# Patient Record
Sex: Female | Born: 1950
Health system: Southern US, Community
[De-identification: ages and names within clinical notes are randomized; demographics above are authoritative.]

## PROBLEM LIST (undated history)

## (undated) DIAGNOSIS — M1990 Unspecified osteoarthritis, unspecified site: Secondary | ICD-10-CM

## (undated) DIAGNOSIS — E7800 Pure hypercholesterolemia, unspecified: Secondary | ICD-10-CM

## (undated) DIAGNOSIS — E079 Disorder of thyroid, unspecified: Secondary | ICD-10-CM

## (undated) DIAGNOSIS — I10 Essential (primary) hypertension: Secondary | ICD-10-CM

## (undated) HISTORY — DX: Essential (primary) hypertension: I10

## (undated) HISTORY — DX: Pure hypercholesterolemia, unspecified: E78.00

## (undated) HISTORY — DX: Unspecified osteoarthritis, unspecified site: M19.90

---

## 2015-03-01 DIAGNOSIS — M069 Rheumatoid arthritis, unspecified: Secondary | ICD-10-CM | POA: Insufficient documentation

## 2015-03-01 DIAGNOSIS — M2550 Pain in unspecified joint: Secondary | ICD-10-CM | POA: Insufficient documentation

## 2015-03-01 DIAGNOSIS — R609 Edema, unspecified: Secondary | ICD-10-CM | POA: Insufficient documentation

## 2015-03-01 DIAGNOSIS — M0579 Rheumatoid arthritis with rheumatoid factor of multiple sites without organ or systems involvement: Secondary | ICD-10-CM | POA: Insufficient documentation

## 2016-03-30 DIAGNOSIS — M85852 Other specified disorders of bone density and structure, left thigh: Secondary | ICD-10-CM | POA: Insufficient documentation

## 2016-06-29 DIAGNOSIS — G453 Amaurosis fugax: Secondary | ICD-10-CM | POA: Insufficient documentation

## 2016-10-03 DIAGNOSIS — M8949 Other hypertrophic osteoarthropathy, multiple sites: Secondary | ICD-10-CM | POA: Insufficient documentation

## 2016-10-03 DIAGNOSIS — M159 Polyosteoarthritis, unspecified: Secondary | ICD-10-CM | POA: Insufficient documentation

## 2016-10-03 DIAGNOSIS — Z79899 Other long term (current) drug therapy: Secondary | ICD-10-CM | POA: Insufficient documentation

## 2019-03-07 DIAGNOSIS — I83893 Varicose veins of bilateral lower extremities with other complications: Secondary | ICD-10-CM | POA: Insufficient documentation

## 2019-04-16 DIAGNOSIS — M792 Neuralgia and neuritis, unspecified: Secondary | ICD-10-CM | POA: Insufficient documentation

## 2019-12-31 DIAGNOSIS — N318 Other neuromuscular dysfunction of bladder: Secondary | ICD-10-CM | POA: Insufficient documentation

## 2019-12-31 DIAGNOSIS — I890 Lymphedema, not elsewhere classified: Secondary | ICD-10-CM | POA: Insufficient documentation

## 2019-12-31 DIAGNOSIS — M810 Age-related osteoporosis without current pathological fracture: Secondary | ICD-10-CM | POA: Insufficient documentation

## 2019-12-31 DIAGNOSIS — E785 Hyperlipidemia, unspecified: Secondary | ICD-10-CM | POA: Insufficient documentation

## 2019-12-31 DIAGNOSIS — M5137 Other intervertebral disc degeneration, lumbosacral region: Secondary | ICD-10-CM | POA: Insufficient documentation

## 2019-12-31 DIAGNOSIS — I10 Essential (primary) hypertension: Secondary | ICD-10-CM | POA: Insufficient documentation

## 2019-12-31 DIAGNOSIS — E039 Hypothyroidism, unspecified: Secondary | ICD-10-CM | POA: Insufficient documentation

## 2019-12-31 DIAGNOSIS — D6859 Other primary thrombophilia: Secondary | ICD-10-CM | POA: Insufficient documentation

## 2019-12-31 DIAGNOSIS — Z79899 Other long term (current) drug therapy: Secondary | ICD-10-CM | POA: Insufficient documentation

## 2019-12-31 DIAGNOSIS — R609 Edema, unspecified: Secondary | ICD-10-CM | POA: Insufficient documentation

## 2019-12-31 DIAGNOSIS — Z6841 Body Mass Index (BMI) 40.0 and over, adult: Secondary | ICD-10-CM | POA: Insufficient documentation

## 2019-12-31 DIAGNOSIS — E559 Vitamin D deficiency, unspecified: Secondary | ICD-10-CM | POA: Insufficient documentation

## 2019-12-31 DIAGNOSIS — R635 Abnormal weight gain: Secondary | ICD-10-CM | POA: Insufficient documentation

## 2020-01-07 DIAGNOSIS — M545 Low back pain, unspecified: Secondary | ICD-10-CM | POA: Insufficient documentation

## 2020-01-07 DIAGNOSIS — G8929 Other chronic pain: Secondary | ICD-10-CM | POA: Insufficient documentation

## 2020-03-17 DIAGNOSIS — K219 Gastro-esophageal reflux disease without esophagitis: Secondary | ICD-10-CM | POA: Insufficient documentation

## 2020-03-17 DIAGNOSIS — I872 Venous insufficiency (chronic) (peripheral): Secondary | ICD-10-CM | POA: Insufficient documentation

## 2020-07-05 DIAGNOSIS — M9901 Segmental and somatic dysfunction of cervical region: Secondary | ICD-10-CM | POA: Diagnosis not present

## 2020-07-05 DIAGNOSIS — M5459 Other low back pain: Secondary | ICD-10-CM | POA: Diagnosis not present

## 2020-07-05 DIAGNOSIS — M9902 Segmental and somatic dysfunction of thoracic region: Secondary | ICD-10-CM | POA: Diagnosis not present

## 2020-07-05 DIAGNOSIS — M9903 Segmental and somatic dysfunction of lumbar region: Secondary | ICD-10-CM | POA: Diagnosis not present

## 2020-07-07 DIAGNOSIS — M9901 Segmental and somatic dysfunction of cervical region: Secondary | ICD-10-CM | POA: Diagnosis not present

## 2020-07-07 DIAGNOSIS — M9903 Segmental and somatic dysfunction of lumbar region: Secondary | ICD-10-CM | POA: Diagnosis not present

## 2020-07-07 DIAGNOSIS — M9902 Segmental and somatic dysfunction of thoracic region: Secondary | ICD-10-CM | POA: Diagnosis not present

## 2020-07-07 DIAGNOSIS — M5459 Other low back pain: Secondary | ICD-10-CM | POA: Diagnosis not present

## 2020-07-08 DIAGNOSIS — M5459 Other low back pain: Secondary | ICD-10-CM | POA: Diagnosis not present

## 2020-07-08 DIAGNOSIS — M9902 Segmental and somatic dysfunction of thoracic region: Secondary | ICD-10-CM | POA: Diagnosis not present

## 2020-07-08 DIAGNOSIS — M9903 Segmental and somatic dysfunction of lumbar region: Secondary | ICD-10-CM | POA: Diagnosis not present

## 2020-07-08 DIAGNOSIS — M9901 Segmental and somatic dysfunction of cervical region: Secondary | ICD-10-CM | POA: Diagnosis not present

## 2020-07-12 ENCOUNTER — Emergency Department
Admission: EM | Admit: 2020-07-12 | Discharge: 2020-07-12 | Disposition: A | Discharge status: Home / Self Care | Payer: Medicare HMO | Attending: Emergency Medicine | Admitting: Emergency Medicine

## 2020-07-12 ENCOUNTER — Other Ambulatory Visit: Payer: Self-pay

## 2020-07-12 ENCOUNTER — Emergency Department: Payer: Medicare HMO

## 2020-07-12 DIAGNOSIS — G8929 Other chronic pain: Secondary | ICD-10-CM | POA: Insufficient documentation

## 2020-07-12 DIAGNOSIS — M5431 Sciatica, right side: Secondary | ICD-10-CM

## 2020-07-12 DIAGNOSIS — E039 Hypothyroidism, unspecified: Secondary | ICD-10-CM | POA: Insufficient documentation

## 2020-07-12 DIAGNOSIS — M5441 Lumbago with sciatica, right side: Secondary | ICD-10-CM | POA: Insufficient documentation

## 2020-07-12 DIAGNOSIS — R52 Pain, unspecified: Secondary | ICD-10-CM | POA: Diagnosis not present

## 2020-07-12 DIAGNOSIS — M25551 Pain in right hip: Secondary | ICD-10-CM | POA: Diagnosis not present

## 2020-07-12 DIAGNOSIS — I10 Essential (primary) hypertension: Secondary | ICD-10-CM | POA: Diagnosis not present

## 2020-07-12 DIAGNOSIS — M25572 Pain in left ankle and joints of left foot: Secondary | ICD-10-CM | POA: Diagnosis not present

## 2020-07-12 MED ORDER — METHOCARBAMOL 500 MG PO TABS
500.0000 mg | ORAL_TABLET | Freq: Once | ORAL | Status: AC
  Administered 2020-07-12: 500 mg via ORAL
  Filled 2020-07-12 (×2): qty 1

## 2020-07-12 MED ORDER — ACETAMINOPHEN 500 MG PO TABS
1000.0000 mg | ORAL_TABLET | Freq: Once | ORAL | Status: AC
  Administered 2020-07-12: 1000 mg via ORAL
  Filled 2020-07-12: qty 2

## 2020-07-12 MED ORDER — KETOROLAC TROMETHAMINE 30 MG/ML IJ SOLN
30.0000 mg | Freq: Once | INTRAMUSCULAR | Status: AC
  Administered 2020-07-12: 30 mg via INTRAMUSCULAR
  Filled 2020-07-12: qty 1

## 2020-07-12 NOTE — ED Provider Notes (Signed)
[facility] Emergency Department Provider Note ____________________________________________   First MD Initiated Contact with Patient 07/12/20 [phone]     (approximate)  I have reviewed the triage vital signs and the nursing notes.  HISTORY  Chief Complaint Hip Pain   HPI Kristen Marquez is a 69 y.o. femalewho presents to the ED for evaluation of hip pain.   Chart review indicates history of HTN, HLD, hypothyroidism, rheumatoid arthritis, morbid obesity and chronic low back pain.  Patient reports she recently moved to the state from [place] and is awaiting establishment with a new PCP, appointment upcoming.  She indicates dealing with chronic lower back pain, as well as chronic shooting pains down her right buttocks associated with this.  She indicates that she saw a new chiropractor 4 days ago, and for the past 3 days has had acute on chronic worsening pain of her right lumbar back rating down her right hip and buttocks.  She denies fevers, falls, injury, difficulty toileting, numbness or tingling to her groin.  She indicates improved symptoms with her meloxicam, prescribed for her RA.  She indicates associated increased lower extremity swelling symmetrically bilaterally.  She reports the pain is currently 7/10 intensity, worsened with movement, and she has not taken any medications in about 24 hours.    No past medical history on file.  There are no problems to display for this patient.   Prior to Admission medications   Not on File    Allergies Patient has no known allergies.  No family history on file.  Social History Social History   Tobacco Use  . Smoking status: Not on file  Substance Use Topics  . Alcohol use: Not on file  . Drug use: Not on file    Review of Systems  Constitutional: No fever/chills Eyes: No visual changes. ENT: No sore throat. Cardiovascular: Denies chest pain. Respiratory: Denies shortness of  breath. Gastrointestinal: No abdominal pain.  No nausea, no vomiting.  No diarrhea.  No constipation. Genitourinary: Negative for dysuria. Musculoskeletal: Positive for acute on chronic low back pain and atraumatic right-sided hip pain Skin: Negative for rash. Neurological: Negative for headaches, focal weakness or numbness.  ____________________________________________   PHYSICAL EXAM:  VITAL SIGNS: Vitals:   07/12/20 0239  BP: (!) 181/74  Pulse: 86  Resp: 16  Temp: 97.9 F (36.6 C)  SpO2: 98%     Constitutional: Alert and oriented. Well appearing and in no acute distress.  Obese.  Supine in bed and conversational in full sentences. Eyes: Conjunctivae are normal. PERRL. EOMI. Head: Atraumatic. Nose: No congestion/rhinnorhea. Mouth/Throat: Mucous membranes are moist.  Oropharynx non-erythematous. Neck: No stridor. No cervical spine tenderness to palpation. Cardiovascular: Normal rate, regular rhythm. Grossly normal heart sounds.  Good peripheral circulation. Respiratory: Normal respiratory effort.  No retractions. Lungs CTAB. Gastrointestinal: Soft , nondistended, nontender to palpation. No abdominal bruits. No CVA tenderness. Musculoskeletal:   No joint effusions. No signs of acute trauma. Vague and poorly localizing mild tenderness to palpation to her right lumbar back and right hip that reproduces her symptoms of shooting pains down her right buttocks and hip. Neurologic:  Normal speech and language. No gross focal neurologic deficits are appreciated. No gait instability noted. Skin:  Skin is warm, dry and intact. No rash noted. Psychiatric: Mood and affect are normal. Speech and behavior are normal.  ____________________________________________   LABS (all labs ordered are listed, but only abnormal results are displayed)  Labs Reviewed - No data to display  ____________________________________________  RADIOLOGY  ED MD interpretation: Plain film of pelvis and  right hip reviewed by me without evidence of acute bony injury.  Official radiology report(s): DG Hip Unilat W or Wo Pelvis 2-3 Views Right  Result Date: 07/12/2020 CLINICAL DATA:  Right hip pain EXAM: DG HIP (WITH OR WITHOUT PELVIS) 2-3V RIGHT COMPARISON:  None. FINDINGS: Single view radiograph of the pelvis and two view radiograph of the right hip demonstrates normal alignment. No fracture or dislocation. No destructive osseous lesion. Hip joint spaces are preserved. Heterotopic ossification is seen adjacent to the greater trochanters bilaterally left greater than right. Soft tissues are unremarkable. IMPRESSION: No acute fracture or dislocation. Electronically Signed   By: Helyn Numbers MD   On: 07/12/2020 03:38    ____________________________________________   PROCEDURES and INTERVENTIONS  Procedure(s) performed (including Critical Care):  Procedures  Medications  acetaminophen (TYLENOL) tablet 1,000 mg (has no administration in time range)  ketorolac (TORADOL) 30 MG/ML injection 30 mg (has no administration in time range)  methocarbamol (ROBAXIN) tablet 500 mg (has no administration in time range)    ____________________________________________   MDM / ED COURSE  Obese 69 year old female with a history of chronic low back pain presents to the ED with acute on chronic atraumatic pain, consistent with sciatica without red flag features, and amenable to outpatient management.  Exam demonstrates classic symptoms and signs of sciatica without red flag symptoms to suggest cord compression.  No evidence of trauma.  She is neurovascularly intact without distress.  Improving pain with Tylenol, NSAIDs and Robaxin.  Advised patient to sparingly use her NSAIDs at home in combination with printed exercises for sciatica, we discussed return precautions for the ED.  Patient medically stable for discharge home.   ____________________________________________   FINAL CLINICAL IMPRESSION(S) / ED  DIAGNOSES  Final diagnoses:  Sciatica of right side  Right hip pain  Primary hypertension     ED Discharge Orders    None       Kristen Marquez   Note:  This document was prepared using Dragon voice recognition software and may include unintentional dictation errors.   Delton Prairie, MD 07/12/20 [phone]

## 2020-07-12 NOTE — Discharge Instructions (Signed)
Use Tylenol for pain and fevers.  Up to 1000 mg per dose, up to 4 times per day.  Do not take more than 4000 mg of Tylenol/acetaminophen within 24 hours..  Continue to take your meloxicam/Mobic as well, as this will be the most helpful medication for your symptoms.  More importantly than either of the above medications is the attached exercises for sciatica to improve your range of motion and strength of the muscles in your back.  If you develop any fevers, inability to walk or inability to use the bathroom, please return to the ED immediately. Otherwise, I have attached the phone number for [facility] clinic, this is a local group of physicians who may be able to see you sooner than March to establish with a new PCP.

## 2020-07-12 NOTE — ED Notes (Signed)
Patient given update regarding wait time, verbalized understanding. 

## 2020-07-12 NOTE — ED Triage Notes (Signed)
Pt with right posterior hip pain that radiates down leg intermittently and into back. Pt denies known injury. Pt with history of arthritis, denies fever.

## 2020-07-12 NOTE — ED Notes (Signed)
E-signature not working at this time. Pt verbalized understanding of D/C instructions, prescriptions and follow up care with no further questions at this time. Pt in NAD and wheeled to lobby at time of D/C.  

## 2020-07-14 ENCOUNTER — Ambulatory Visit: Payer: Self-pay

## 2020-07-14 DIAGNOSIS — M9906 Segmental and somatic dysfunction of lower extremity: Secondary | ICD-10-CM | POA: Diagnosis not present

## 2020-07-14 DIAGNOSIS — M9901 Segmental and somatic dysfunction of cervical region: Secondary | ICD-10-CM | POA: Diagnosis not present

## 2020-07-14 DIAGNOSIS — L98491 Non-pressure chronic ulcer of skin of other sites limited to breakdown of skin: Secondary | ICD-10-CM | POA: Diagnosis not present

## 2020-07-14 DIAGNOSIS — M9903 Segmental and somatic dysfunction of lumbar region: Secondary | ICD-10-CM | POA: Diagnosis not present

## 2020-07-14 DIAGNOSIS — I739 Peripheral vascular disease, unspecified: Secondary | ICD-10-CM | POA: Diagnosis not present

## 2020-07-14 DIAGNOSIS — L03116 Cellulitis of left lower limb: Secondary | ICD-10-CM | POA: Diagnosis not present

## 2020-07-14 DIAGNOSIS — R609 Edema, unspecified: Secondary | ICD-10-CM | POA: Diagnosis not present

## 2020-07-14 DIAGNOSIS — M9902 Segmental and somatic dysfunction of thoracic region: Secondary | ICD-10-CM | POA: Diagnosis not present

## 2020-07-14 NOTE — Telephone Encounter (Signed)
Patient call stating that her legs ae Swollen and she tried to get established with [facility].  She states that the swelling in her legs is a chronic condition that she has been treated for for many years.  She state that the left leg is red and when at the chiropractor today they suggested that she see a physician.  A note was sent to [facility] by agent to try to get patient earlier appointment  She refused first available because see needed care now.  She states that her left leg has some sore  developing. Swelling includes above knee and feet.  Feet feel numb.  Patient will [facility] to [facility] today for care.  She was encouraged to please call back in morning and take whatever visit we can give for establishing care to make sure she gets establish with a PCP. She verbalized understanding of all information and agrees with plan of care.  Reason for Disposition . SEVERE leg swelling (e.g., swelling extends above knee, entire leg is swollen, weeping fluid)  Answer Assessment - Initial Assessment Questions 1. ONSET: "When did the swelling start?" (e.g., minutes, hours, days)     A lone time 2. LOCATION: "What part of the leg is swollen?"  "Are both legs swollen or just one leg?"     Both legs left is red 3. SEVERITY: "How bad is the swelling?" (e.g., localized; mild, moderate, severe)  - Localized - small area of swelling localized to one leg  - MILD pedal edema - swelling limited to foot and ankle, pitting edema < 1/4 inch (6 mm) deep, rest and elevation eliminate most or all swelling  - MODERATE edema - swelling of lower leg to knee, pitting edema > 1/4 inch (6 mm) deep, rest and elevation only partially reduce swelling  - SEVERE edema - swelling extends above knee, facial or hand swelling present    Above the knee 4. REDNESS: "Does the swelling look red or infected?"    In left leg 5. PAIN: "Is the swelling painful to touch?" If Yes, ask: "How painful is it?"   (Scale 1-10; mild, moderate or  severe)     Rt foot feels banded around it below ankle 6. FEVER: "Do you have a fever?" If Yes, ask: "What is it, how was it measured, and when did it start?"      No feet are numb 7. CAUSE: "What do you think is causing the leg swelling?"     unsure 8. MEDICAL HISTORY: "Do you have a history of heart failure, kidney disease, liver failure, or cancer?"    Ra nothing else 9. RECURRENT SYMPTOM: "Have you had leg swelling before?" If Yes, ask: "When was the last time?" "What happened that time?"    Yes is chronic 10. OTHER SYMPTOMS: "Do you have any other symptoms?" (e.g., chest pain, difficulty breathing)      No 11. PREGNANCY: "Is there any chance you are pregnant?" "When was your last menstrual period?"       N/A  Protocols used: LEG SWELLING AND EDEMA-A-AH

## 2020-07-15 DIAGNOSIS — M5136 Other intervertebral disc degeneration, lumbar region: Secondary | ICD-10-CM | POA: Diagnosis not present

## 2020-07-17 DIAGNOSIS — M5136 Other intervertebral disc degeneration, lumbar region: Secondary | ICD-10-CM | POA: Diagnosis not present

## 2020-07-17 DIAGNOSIS — M545 Low back pain, unspecified: Secondary | ICD-10-CM | POA: Diagnosis not present

## 2020-07-22 ENCOUNTER — Encounter (INDEPENDENT_AMBULATORY_CARE_PROVIDER_SITE_OTHER): Payer: Self-pay | Admitting: Nurse Practitioner

## 2020-07-22 ENCOUNTER — Ambulatory Visit (INDEPENDENT_AMBULATORY_CARE_PROVIDER_SITE_OTHER): Payer: Medicare HMO | Admitting: Nurse Practitioner

## 2020-07-22 ENCOUNTER — Other Ambulatory Visit: Payer: Self-pay

## 2020-07-22 VITALS — BP 135/79 | HR 75 | Ht 61.0 in | Wt 262.0 lb

## 2020-07-22 DIAGNOSIS — I890 Lymphedema, not elsewhere classified: Secondary | ICD-10-CM

## 2020-07-22 DIAGNOSIS — E785 Hyperlipidemia, unspecified: Secondary | ICD-10-CM | POA: Diagnosis not present

## 2020-07-22 DIAGNOSIS — K219 Gastro-esophageal reflux disease without esophagitis: Secondary | ICD-10-CM | POA: Diagnosis not present

## 2020-07-29 ENCOUNTER — Encounter (INDEPENDENT_AMBULATORY_CARE_PROVIDER_SITE_OTHER): Payer: Self-pay | Admitting: Nurse Practitioner

## 2020-07-29 DIAGNOSIS — M5441 Lumbago with sciatica, right side: Secondary | ICD-10-CM | POA: Diagnosis not present

## 2020-07-29 DIAGNOSIS — Z79899 Other long term (current) drug therapy: Secondary | ICD-10-CM | POA: Diagnosis not present

## 2020-07-29 DIAGNOSIS — M7989 Other specified soft tissue disorders: Secondary | ICD-10-CM | POA: Diagnosis not present

## 2020-07-29 DIAGNOSIS — G8929 Other chronic pain: Secondary | ICD-10-CM | POA: Diagnosis not present

## 2020-07-29 DIAGNOSIS — M19042 Primary osteoarthritis, left hand: Secondary | ICD-10-CM | POA: Diagnosis not present

## 2020-07-29 DIAGNOSIS — M8949 Other hypertrophic osteoarthropathy, multiple sites: Secondary | ICD-10-CM | POA: Diagnosis not present

## 2020-07-29 DIAGNOSIS — M0579 Rheumatoid arthritis with rheumatoid factor of multiple sites without organ or systems involvement: Secondary | ICD-10-CM | POA: Diagnosis not present

## 2020-07-29 NOTE — Progress Notes (Signed)
Subjective:    Patient ID: Kristen Marquez, female    DOB: 16-May-1951, 69 y.o.   MRN: [id number] Chief Complaint  Patient presents with  . New Patient (Initial Visit)    Stat. Wynona Canes.Pitting.edema . Ulcer    Patient is seen for evaluation of leg pain and leg swelling. The patient first noticed the swelling remotely. The swelling is associated with pain and discoloration. The pain and swelling worsens with prolonged dependency and improves with elevation. The pain is unrelated to activity.  The patient notes that in the morning the legs are significantly improved but they steadily worsened throughout the course of the day. The patient also notes a steady worsening of the discoloration in the ankle and shin area.   The patient denies claudication symptoms.  The patient denies symptoms consistent with rest pain.  The patient denies and extensive history of DJD and LS spine disease.  The patient has no had any past angiography, interventions or vascular surgery.  Elevation makes the leg symptoms better, dependency makes them much worse. There is no history of ulcerations. The patient denies any recent changes in medications.  The patient has not been wearing graduated compression.  The patient denies a history of DVT or PE. There is no prior history of phlebitis. There is no history of primary lymphedema.  No history of malignancies. No history of trauma or groin or pelvic surgery. There is no history of radiation treatment to the groin or pelvis  The patient denies amaurosis fugax or recent TIA symptoms. There are no recent neurological changes noted. The patient denies recent episodes of angina or shortness of breath   Review of Systems  Cardiovascular: Positive for leg swelling.  Skin: Negative for wound.  All other systems reviewed and are negative.      Objective:   Physical Exam Vitals reviewed.  Constitutional:      Appearance: She is obese.  HENT:     Head: Normocephalic.   Cardiovascular:     Rate and Rhythm: Normal rate and regular rhythm.     Pulses: Normal pulses.  Pulmonary:     Effort: Pulmonary effort is normal.  Musculoskeletal:     Right lower leg: Edema present.     Left lower leg: Edema present.  Skin:    General: Skin is warm and dry.  Neurological:     Mental Status: She is alert and oriented to person, place, and time. Mental status is at baseline.  Psychiatric:        Mood and Affect: Mood normal.        Behavior: Behavior normal.        Thought Content: Thought content normal.        Judgment: Judgment normal.     BP 135/79   Pulse 75   Ht 5\' 1"  (1.549 m)   Wt 262 lb (118.8 kg)   BMI 49.50 kg/m   Past Medical History:  Diagnosis Date  . Arthritis   . High cholesterol   . Hypertension     Social History   Socioeconomic History  . Marital status: Married    Spouse name: Not on file  . Number of children: Not on file  . Years of education: Not on file  . Highest education level: Not on file  Occupational History  . Not on file  Tobacco Use  . Smoking status: Current Every Day Smoker    Types: Cigarettes    Start date: 07/23/1995  . Smokeless tobacco: Never  Used  Substance and Sexual Activity  . Alcohol use: Not on file  . Drug use: Not on file  . Sexual activity: Not on file  Other Topics Concern  . Not on file  Social History Narrative  . Not on file   Social Determinants of Health   Financial Resource Strain:   . Difficulty of Paying Living Expenses: Not on file  Food Insecurity:   . Worried About Programme researcher, broadcasting/film/video in the Last Year: Not on file  . Ran Out of Food in the Last Year: Not on file  Transportation Needs:   . Lack of Transportation (Medical): Not on file  . Lack of Transportation (Non-Medical): Not on file  Physical Activity:   . Days of Exercise per Week: Not on file  . Minutes of Exercise per Session: Not on file  Stress:   . Feeling of Stress : Not on file  Social Connections:   .  Frequency of Communication with Friends and Family: Not on file  . Frequency of Social Gatherings with Friends and Family: Not on file  . Attends Religious Services: Not on file  . Active Member of Clubs or Organizations: Not on file  . Attends Banker Meetings: Not on file  . Marital Status: Not on file  Intimate Partner Violence:   . Fear of Current or Ex-Partner: Not on file  . Emotionally Abused: Not on file  . Physically Abused: Not on file  . Sexually Abused: Not on file    History reviewed. No pertinent surgical history.  History reviewed. No pertinent family history.  Allergies  Allergen Reactions  . Diltiazem Hcl Other (See Comments)    Headache Headache     No flowsheet data found.    CMP  No results found for: NA, K, CL, CO2, GLUCOSE, BUN, CREATININE, CALCIUM, PROT, ALBUMIN, AST, ALT, ALKPHOS, BILITOT, GFRNONAA, GFRAA   No results found.     Assessment & Plan:   1. Lymphedema, not elsewhere classified I have had a long discussion with the patient regarding swelling and why it  causes symptoms.  Patient will begin wearing graduated compression stockings class 1 (20-30 mmHg) on a daily basis a prescription was given. The patient will  beginning wearing the stockings first thing in the morning and removing them in the evening. The patient is instructed specifically not to sleep in the stockings.   In addition, behavioral modification will be initiated.  This will include frequent elevation, use of over the counter pain medications and exercise such as walking.  I have reviewed systemic causes for chronic edema such as liver, kidney and cardiac etiologies.  The patient denies problems with these organ systems.    Consideration for a lymph pump will also be made based upon the effectiveness of conservative therapy.  This would help to improve the edema control and prevent sequela such as ulcers and infections   Patient should undergo duplex ultrasound  of the venous system to ensure that DVT or reflux is not present.  The patient will follow-up with me after the ultrasound.    2. Gastroesophageal reflux disease, unspecified whether esophagitis present Continue PPI as already ordered, this medication has been reviewed and there are no changes at this time.  Avoidence of caffeine and alcohol  Moderate elevation of the head of the bed   3. Hyperlipidemia, unspecified hyperlipidemia type Continue statin as ordered and reviewed, no changes at this time    Current Outpatient Medications on File  Prior to Visit  Medication Sig Dispense Refill  . amLODipine (NORVASC) 5 MG tablet Take by mouth.    Marland Kitchen atorvastatin (LIPITOR) 40 MG tablet Take by mouth.    . folic acid (FOLVITE) 1 MG tablet Take by mouth.    . furosemide (LASIX) 20 MG tablet Take by mouth.    . levothyroxine (SYNTHROID) 100 MCG tablet Take by mouth.    . meclizine (ANTIVERT) 25 MG tablet Take by mouth.    . meloxicam (MOBIC) 7.5 MG tablet Take by mouth.    . methotrexate (RHEUMATREX) 2.5 MG tablet Take by mouth.    . metoprolol tartrate (LOPRESSOR) 25 MG tablet Take by mouth.    . oxybutynin (DITROPAN) 5 MG tablet Take by mouth.    . pantoprazole (PROTONIX) 40 MG tablet Take by mouth.    . simvastatin (ZOCOR) 20 MG tablet     . aspirin 81 MG chewable tablet Chew by mouth.    . Cholecalciferol 50 MCG (2000 UT) CAPS Take by mouth.     No current facility-administered medications on file prior to visit.    There are no Patient Instructions on file for this visit. No follow-ups on file.   Georgiana Spinner, NP

## 2020-07-30 DIAGNOSIS — M545 Low back pain, unspecified: Secondary | ICD-10-CM | POA: Diagnosis not present

## 2020-07-30 DIAGNOSIS — M5136 Other intervertebral disc degeneration, lumbar region: Secondary | ICD-10-CM | POA: Diagnosis not present

## 2020-08-03 DIAGNOSIS — M5136 Other intervertebral disc degeneration, lumbar region: Secondary | ICD-10-CM | POA: Diagnosis not present

## 2020-08-03 DIAGNOSIS — M545 Low back pain, unspecified: Secondary | ICD-10-CM | POA: Diagnosis not present

## 2020-08-10 ENCOUNTER — Other Ambulatory Visit (INDEPENDENT_AMBULATORY_CARE_PROVIDER_SITE_OTHER): Payer: Self-pay | Admitting: Nurse Practitioner

## 2020-08-10 DIAGNOSIS — I890 Lymphedema, not elsewhere classified: Secondary | ICD-10-CM

## 2020-08-11 ENCOUNTER — Other Ambulatory Visit: Payer: Self-pay

## 2020-08-11 ENCOUNTER — Ambulatory Visit (INDEPENDENT_AMBULATORY_CARE_PROVIDER_SITE_OTHER): Payer: Medicare HMO | Admitting: Nurse Practitioner

## 2020-08-11 ENCOUNTER — Ambulatory Visit (INDEPENDENT_AMBULATORY_CARE_PROVIDER_SITE_OTHER): Payer: Medicare HMO

## 2020-08-11 VITALS — BP 168/77 | HR 76 | Ht 61.0 in | Wt 272.0 lb

## 2020-08-11 DIAGNOSIS — I890 Lymphedema, not elsewhere classified: Secondary | ICD-10-CM

## 2020-08-11 DIAGNOSIS — E785 Hyperlipidemia, unspecified: Secondary | ICD-10-CM | POA: Diagnosis not present

## 2020-08-11 DIAGNOSIS — I10 Essential (primary) hypertension: Secondary | ICD-10-CM

## 2020-08-13 DIAGNOSIS — M5136 Other intervertebral disc degeneration, lumbar region: Secondary | ICD-10-CM | POA: Diagnosis not present

## 2020-08-13 DIAGNOSIS — M545 Low back pain, unspecified: Secondary | ICD-10-CM | POA: Diagnosis not present

## 2020-08-16 ENCOUNTER — Encounter (INDEPENDENT_AMBULATORY_CARE_PROVIDER_SITE_OTHER): Payer: Self-pay | Admitting: Nurse Practitioner

## 2020-08-16 NOTE — Progress Notes (Signed)
Subjective:    Patient ID: Kristen Marquez, female    DOB: [birth year], 69 y.o.   MRN: [id number] Chief Complaint  Patient presents with  . Follow-up    U/S follow up    The patient returns to the office for followup evaluation regarding leg swelling.  The swelling has persisted and the pain associated with swelling continues. There have not been any interval development of a ulcerations or wounds.  Since the previous visit the patient has been wearing graduated compression stockings and has noted little if any improvement in the lymphedema. The patient has been trying to use compression but has been unable to find a good pair of compression.   The patient also states elevation during the day and exercise is being done too.   Today noninvasive studies show no evidence of DVT or superficial venous thrombosis bilaterally.  There is evidence of reflux in the right great saphenous vein at the saphenofemoral femoral junction.  No evidence of deep venous insufficiency seen bilaterally.  There is free fluid noted in the bilateral knees.  The patient also has evidence of previous ablation and stripping of the great saphenous vein bilaterally.   Review of Systems  Cardiovascular: Positive for leg swelling.  Musculoskeletal: Positive for arthralgias and gait problem.  All other systems reviewed and are negative.      Objective:   Physical Exam Vitals reviewed.  Constitutional:      Appearance: She is obese.  HENT:     Head: Normocephalic.  Cardiovascular:     Rate and Rhythm: Normal rate.     Pulses: Decreased pulses.  Pulmonary:     Effort: Pulmonary effort is normal.  Musculoskeletal:     Right lower leg: 3+ Edema present.     Left lower leg: 3+ Edema present.  Neurological:     Mental Status: She is alert and oriented to person, place, and time.  Psychiatric:        Mood and Affect: Mood normal.        Behavior: Behavior normal.        Thought Content: Thought content normal.         Judgment: Judgment normal.     BP (!) 168/77   Pulse 76   Ht 5\' 1"  (1.549 m)   Wt 272 lb (123.4 kg)   BMI 51.39 kg/m   Past Medical History:  Diagnosis Date  . Arthritis   . High cholesterol   . Hypertension     Social History   Socioeconomic History  . Marital status: Married    Spouse name: Not on file  . Number of children: Not on file  . Years of education: Not on file  . Highest education level: Not on file  Occupational History  . Not on file  Tobacco Use  . Smoking status: Current Every Day Smoker    Types: Cigarettes    Start date: 07/23/1995  . Smokeless tobacco: Never Used  Substance and Sexual Activity  . Alcohol use: Not on file  . Drug use: Not on file  . Sexual activity: Not on file  Other Topics Concern  . Not on file  Social History Narrative  . Not on file   Social Determinants of Health   Financial Resource Strain:   . Difficulty of Paying Living Expenses: Not on file  Food Insecurity:   . Worried About Programme researcher, broadcasting/film/video in the Last Year: Not on file  . Ran Out of Food in  the Last Year: Not on file  Transportation Needs:   . Lack of Transportation (Medical): Not on file  . Lack of Transportation (Non-Medical): Not on file  Physical Activity:   . Days of Exercise per Week: Not on file  . Minutes of Exercise per Session: Not on file  Stress:   . Feeling of Stress : Not on file  Social Connections:   . Frequency of Communication with Friends and Family: Not on file  . Frequency of Social Gatherings with Friends and Family: Not on file  . Attends Religious Services: Not on file  . Active Member of Clubs or Organizations: Not on file  . Attends Banker Meetings: Not on file  . Marital Status: Not on file  Intimate Partner Violence:   . Fear of Current or Ex-Partner: Not on file  . Emotionally Abused: Not on file  . Physically Abused: Not on file  . Sexually Abused: Not on file    History reviewed. No pertinent  surgical history.  History reviewed. No pertinent family history.  Allergies  Allergen Reactions  . Diltiazem Hcl Other (See Comments)    Headache Headache     No flowsheet data found.    CMP  No results found for: NA, K, CL, CO2, GLUCOSE, BUN, CREATININE, CALCIUM, PROT, ALBUMIN, AST, ALT, ALKPHOS, BILITOT, GFRNONAA, GFRAA   No results found.     Assessment & Plan:   1. Lymphedema Recommend:  No surgery or intervention at this point in time.    We will place patient in Unna wraps to regain control of edema, to be changed weekly.  I have reviewed my previous discussion with the patient regarding swelling and why it causes symptoms.  Patient will continue wearing graduated compression stockings class 1 (20-30 mmHg) on a daily basis. The patient will  beginning wearing the stockings first thing in the morning and removing them in the evening. The patient is instructed specifically not to sleep in the stockings.    In addition, behavioral modification including several periods of elevation of the lower extremities during the day will be continued.  This was reviewed with the patient during the initial visit.  The patient will also continue routine exercise, especially walking on a daily basis as was discussed during the initial visit.    Despite conservative treatments of at least 4 weeks including graduated compression therapy class 1 and behavioral modification including exercise and elevation the patient  has not obtained adequate control of the lymphedema.  The patient still has stage 3 lymphedema and therefore, I believe that a lymph pump should be added to improve the control of the patient's lymphedema.  Additionally, a lymph pump is warranted because it will reduce the risk of cellulitis and ulceration in the future.  Patient should follow-up in four weeks   2. Benign essential hypertension Continue antihypertensive medications as already ordered, these medications have  been reviewed and there are no changes at this time.   3. Hyperlipidemia, unspecified hyperlipidemia type Continue statin as ordered and reviewed, no changes at this time    Current Outpatient Medications on File Prior to Visit  Medication Sig Dispense Refill  . amLODipine (NORVASC) 5 MG tablet Take by mouth.    Marland Kitchen aspirin 81 MG chewable tablet Chew by mouth.    Marland Kitchen atorvastatin (LIPITOR) 40 MG tablet Take by mouth.    . Cholecalciferol 50 MCG (2000 UT) CAPS Take by mouth.    . folic acid (FOLVITE) 1 MG  tablet Take by mouth.    . furosemide (LASIX) 20 MG tablet Take by mouth.    . levothyroxine (SYNTHROID) 100 MCG tablet Take by mouth.    . meclizine (ANTIVERT) 25 MG tablet Take by mouth.    . meloxicam (MOBIC) 7.5 MG tablet Take by mouth.    . methotrexate (RHEUMATREX) 2.5 MG tablet Take by mouth.    . metoprolol tartrate (LOPRESSOR) 25 MG tablet Take by mouth.    . oxybutynin (DITROPAN) 5 MG tablet Take by mouth.    . pantoprazole (PROTONIX) 40 MG tablet Take by mouth.    . simvastatin (ZOCOR) 20 MG tablet      No current facility-administered medications on file prior to visit.    There are no Patient Instructions on file for this visit. No follow-ups on file.   Georgiana Spinner, NP

## 2020-08-18 ENCOUNTER — Other Ambulatory Visit: Payer: Self-pay

## 2020-08-18 ENCOUNTER — Ambulatory Visit (INDEPENDENT_AMBULATORY_CARE_PROVIDER_SITE_OTHER): Payer: Medicare HMO | Admitting: Nurse Practitioner

## 2020-08-18 ENCOUNTER — Encounter (INDEPENDENT_AMBULATORY_CARE_PROVIDER_SITE_OTHER): Payer: Self-pay

## 2020-08-18 VITALS — BP 167/83 | HR 72 | Resp 16 | Wt 271.0 lb

## 2020-08-18 DIAGNOSIS — I890 Lymphedema, not elsewhere classified: Secondary | ICD-10-CM | POA: Diagnosis not present

## 2020-08-18 NOTE — Progress Notes (Signed)
History of Present Illness  There is no documented history at this time  Assessments & Plan   There are no diagnoses linked to this encounter.    Additional instructions  Subjective:  Patient presents with venous ulcer of the Bilateral lower extremity.    Procedure:  3 layer unna wrap was placed Bilateral lower extremity.   Plan:   Follow up in one week.  

## 2020-08-19 DIAGNOSIS — M5136 Other intervertebral disc degeneration, lumbar region: Secondary | ICD-10-CM | POA: Diagnosis not present

## 2020-08-19 DIAGNOSIS — M545 Low back pain, unspecified: Secondary | ICD-10-CM | POA: Diagnosis not present

## 2020-08-20 DIAGNOSIS — M545 Low back pain, unspecified: Secondary | ICD-10-CM | POA: Diagnosis not present

## 2020-08-22 ENCOUNTER — Encounter (INDEPENDENT_AMBULATORY_CARE_PROVIDER_SITE_OTHER): Payer: Self-pay | Admitting: Nurse Practitioner

## 2020-08-24 ENCOUNTER — Ambulatory Visit (INDEPENDENT_AMBULATORY_CARE_PROVIDER_SITE_OTHER): Payer: Medicare HMO | Admitting: Nurse Practitioner

## 2020-08-24 ENCOUNTER — Other Ambulatory Visit: Payer: Self-pay

## 2020-08-24 VITALS — BP 163/79 | HR 70 | Ht 61.0 in | Wt 271.0 lb

## 2020-08-24 DIAGNOSIS — I890 Lymphedema, not elsewhere classified: Secondary | ICD-10-CM | POA: Diagnosis not present

## 2020-08-24 NOTE — Progress Notes (Signed)
History of Present Illness  There is no documented history at this time  Assessments & Plan   There are no diagnoses linked to this encounter.    Additional instructions  Subjective:  Patient presents with venous ulcer of the Bilateral lower extremity.    Procedure:  3 layer unna wrap was placed Bilateral lower extremity.   Plan:   Follow up in one week.2 

## 2020-08-25 ENCOUNTER — Encounter (INDEPENDENT_AMBULATORY_CARE_PROVIDER_SITE_OTHER): Payer: Medicare HMO

## 2020-08-30 ENCOUNTER — Encounter (INDEPENDENT_AMBULATORY_CARE_PROVIDER_SITE_OTHER): Payer: Self-pay | Admitting: Nurse Practitioner

## 2020-09-01 ENCOUNTER — Other Ambulatory Visit: Payer: Self-pay

## 2020-09-01 ENCOUNTER — Encounter (INDEPENDENT_AMBULATORY_CARE_PROVIDER_SITE_OTHER): Payer: Self-pay | Admitting: Nurse Practitioner

## 2020-09-01 ENCOUNTER — Ambulatory Visit (INDEPENDENT_AMBULATORY_CARE_PROVIDER_SITE_OTHER): Payer: Medicare HMO | Admitting: Nurse Practitioner

## 2020-09-01 VITALS — BP 165/86 | HR 82 | Resp 16 | Wt 272.0 lb

## 2020-09-01 DIAGNOSIS — I890 Lymphedema, not elsewhere classified: Secondary | ICD-10-CM

## 2020-09-01 DIAGNOSIS — I10 Essential (primary) hypertension: Secondary | ICD-10-CM | POA: Diagnosis not present

## 2020-09-01 DIAGNOSIS — E785 Hyperlipidemia, unspecified: Secondary | ICD-10-CM

## 2020-09-07 ENCOUNTER — Other Ambulatory Visit: Payer: Self-pay

## 2020-09-07 ENCOUNTER — Ambulatory Visit (INDEPENDENT_AMBULATORY_CARE_PROVIDER_SITE_OTHER): Payer: Medicare HMO | Admitting: Nurse Practitioner

## 2020-09-07 ENCOUNTER — Encounter (INDEPENDENT_AMBULATORY_CARE_PROVIDER_SITE_OTHER): Payer: Self-pay

## 2020-09-07 ENCOUNTER — Encounter (INDEPENDENT_AMBULATORY_CARE_PROVIDER_SITE_OTHER): Payer: Self-pay | Admitting: Nurse Practitioner

## 2020-09-07 VITALS — BP 181/80 | HR 77 | Resp 16 | Wt 276.0 lb

## 2020-09-07 DIAGNOSIS — I890 Lymphedema, not elsewhere classified: Secondary | ICD-10-CM | POA: Diagnosis not present

## 2020-09-07 NOTE — Progress Notes (Signed)
History of Present Illness  There is no documented history at this time  Assessments & Plan   There are no diagnoses linked to this encounter.    Additional instructions  Subjective:  Patient presents with venous ulcer of the Bilateral lower extremity.    Procedure:  3 layer unna wrap was placed Bilateral lower extremity.   Plan:   Follow up in one week.  

## 2020-09-11 ENCOUNTER — Encounter: Payer: Self-pay | Admitting: Emergency Medicine

## 2020-09-11 DIAGNOSIS — Z79899 Other long term (current) drug therapy: Secondary | ICD-10-CM | POA: Diagnosis not present

## 2020-09-11 DIAGNOSIS — E039 Hypothyroidism, unspecified: Secondary | ICD-10-CM | POA: Insufficient documentation

## 2020-09-11 DIAGNOSIS — Z7982 Long term (current) use of aspirin: Secondary | ICD-10-CM | POA: Diagnosis not present

## 2020-09-11 DIAGNOSIS — I10 Essential (primary) hypertension: Secondary | ICD-10-CM | POA: Insufficient documentation

## 2020-09-11 DIAGNOSIS — N179 Acute kidney failure, unspecified: Secondary | ICD-10-CM | POA: Insufficient documentation

## 2020-09-11 DIAGNOSIS — G4489 Other headache syndrome: Secondary | ICD-10-CM | POA: Diagnosis not present

## 2020-09-11 DIAGNOSIS — F17210 Nicotine dependence, cigarettes, uncomplicated: Secondary | ICD-10-CM | POA: Diagnosis not present

## 2020-09-11 LAB — CBC
HCT: 37.6 % (ref 36.0–46.0)
Hemoglobin: 12.4 g/dL (ref 12.0–15.0)
MCH: 32.5 pg (ref 26.0–34.0)
MCHC: 33 g/dL (ref 30.0–36.0)
MCV: 98.7 fL (ref 80.0–100.0)
Platelets: 242 10*3/uL (ref 150–400)
RBC: 3.81 MIL/uL — ABNORMAL LOW (ref 3.87–5.11)
RDW: 15.4 % (ref 11.5–15.5)
WBC: 7.7 10*3/uL (ref 4.0–10.5)
nRBC: 0 % (ref 0.0–0.2)

## 2020-09-11 LAB — BASIC METABOLIC PANEL
Anion gap: 12 (ref 5–15)
BUN: 39 mg/dL — ABNORMAL HIGH (ref 8–23)
CO2: 29 mmol/L (ref 22–32)
Calcium: 9.6 mg/dL (ref 8.9–10.3)
Chloride: 100 mmol/L (ref 98–111)
Creatinine, Ser: 1.84 mg/dL — ABNORMAL HIGH (ref 0.44–1.00)
GFR, Estimated: 29 mL/min — ABNORMAL LOW (ref >60)
Glucose, Bld: 113 mg/dL — ABNORMAL HIGH (ref 70–99)
Potassium: 4.2 mmol/L (ref 3.5–5.1)
Sodium: 141 mmol/L (ref 135–145)

## 2020-09-11 LAB — TROPONIN I (HIGH SENSITIVITY): Troponin I (High Sensitivity): 10 ng/L (ref <18)

## 2020-09-11 NOTE — ED Triage Notes (Signed)
Pt arrived via EMS from home due to high BP readings. Pt reports 181/100. Pt compliant with AM BP medication. the patient denies symptoms other than HA earlier today.

## 2020-09-11 NOTE — ED Notes (Signed)
Patient to triage via wheelchair by EMS.  Per EMS patient with intermittent headache for days.  BP elevated with medic 1st on scene 180/100 (left arm) 180/p (right arm).  [facility] vitals -- hr 90, bp 141/78, pulse oxi 100% on room air.

## 2020-09-12 ENCOUNTER — Emergency Department
Admission: EM | Admit: 2020-09-12 | Discharge: 2020-09-12 | Disposition: A | Discharge status: Home / Self Care | Payer: Medicare HMO | Attending: Emergency Medicine | Admitting: Emergency Medicine

## 2020-09-12 ENCOUNTER — Encounter (INDEPENDENT_AMBULATORY_CARE_PROVIDER_SITE_OTHER): Payer: Self-pay | Admitting: Nurse Practitioner

## 2020-09-12 DIAGNOSIS — N179 Acute kidney failure, unspecified: Secondary | ICD-10-CM

## 2020-09-12 DIAGNOSIS — I10 Essential (primary) hypertension: Secondary | ICD-10-CM

## 2020-09-12 LAB — BASIC METABOLIC PANEL
Anion gap: 9 (ref 5–15)
BUN: 35 mg/dL — ABNORMAL HIGH (ref 8–23)
CO2: 27 mmol/L (ref 22–32)
Calcium: 8.8 mg/dL — ABNORMAL LOW (ref 8.9–10.3)
Chloride: 106 mmol/L (ref 98–111)
Creatinine, Ser: 1.14 mg/dL — ABNORMAL HIGH (ref 0.44–1.00)
GFR, Estimated: 52 mL/min — ABNORMAL LOW (ref >60)
Glucose, Bld: 96 mg/dL (ref 70–99)
Potassium: 3.4 mmol/L — ABNORMAL LOW (ref 3.5–5.1)
Sodium: 142 mmol/L (ref 135–145)

## 2020-09-12 LAB — TROPONIN I (HIGH SENSITIVITY): Troponin I (High Sensitivity): 11 ng/L (ref <18)

## 2020-09-12 MED ORDER — LACTATED RINGERS IV BOLUS
1000.0000 mL | Freq: Once | INTRAVENOUS | Status: AC
  Administered 2020-09-12: 1000 mL via INTRAVENOUS

## 2020-09-12 MED ORDER — AMLODIPINE BESYLATE 5 MG PO TABS
5.0000 mg | ORAL_TABLET | Freq: Once | ORAL | Status: AC
  Administered 2020-09-12: 5 mg via ORAL
  Filled 2020-09-12: qty 1

## 2020-09-12 MED ORDER — METOPROLOL TARTRATE 25 MG PO TABS
25.0000 mg | ORAL_TABLET | Freq: Once | ORAL | Status: AC
  Administered 2020-09-12: 25 mg via ORAL
  Filled 2020-09-12: qty 1

## 2020-09-12 NOTE — Discharge Instructions (Signed)
Continue to monitor your blood pressure at home and keep a diary. Follow up with your PCP for any changes needed to your blood pressure medication. In the meantime reduce your lasix to 20mg  every other day instead of every day. You will need to follow up with nephrology for management of your kidney function.  Please return to the emergency room if you have chest pain, severe headache, shortness of breath, dizziness, or any signs of stroke which include facial droop, slurred speech, difficulty finding words, unilateral weakness or numbness.

## 2020-09-12 NOTE — ED Provider Notes (Signed)
[facility] Emergency Department Provider Note  ____________________________________________  Time seen: Approximately 6:29 AM  I have reviewed the triage vital signs and the nursing notes.   HISTORY  Chief Complaint Hypertension   HPI Kristen Marquez is a 70 y.o. female with a history of GERD, venous insufficiency, hypertension, hyperlipidemia, hypothyroidism, rheumatoid arthritis who presents for evaluation of hypertension.  Patient reports that  on her most recent visit with vascular surgery 10 days ago she was noted to have elevated blood pressure.  She was told to keep an eye on it at home.  She endorses compliance with her medications.  She reports that over the last few days she has had this short living lasting seconds at a time stabbing frontal headaches that come and go very fast.  So she decided to check her blood pressure again yesterday and found that the systolic was in the 180s which made her very concerned.  Patient reports that her pressures usually in the 120s and 130s on amlodipine and metoprolol.  No changes in her diet, no dietary indiscretions, she has not missed any of her medications.  She denies dizziness, slurred speech, facial droop, diplopia, dysarthria, dysphagia, unilateral weakness or numbness, any constant or thunderclap headache, chest pain or shortness of breath.  Past Medical History:  Diagnosis Date  . Arthritis   . High cholesterol   . Hypertension     Patient Active Problem List   Diagnosis Date Noted  . Gastroesophageal reflux disease 03/17/2020  . Venous (peripheral) insufficiency 03/17/2020  . Chronic bilateral low back pain without sciatica 01/07/2020  . Benign essential hypertension 12/31/2019  . BMI 45.0-49.9, adult (HCC) 12/31/2019  . DDD (degenerative disc disease), lumbosacral 12/31/2019  . Dependent edema 12/31/2019  . Heterozygous protein S deficiency (HCC) 12/31/2019  . Hyperlipidemia 12/31/2019  .  Hypertonicity of bladder 12/31/2019  . Hypothyroidism 12/31/2019  . Lymphedema, not elsewhere classified 12/31/2019  . Osteoporosis 12/31/2019  . Other long term (current) drug therapy 12/31/2019  . Vitamin D deficiency 12/31/2019  . Weight gain 12/31/2019  . Neuropathic pain 04/16/2019  . Varicose veins of leg with edema, bilateral 03/07/2019  . Long-term use of high-risk medication 10/03/2016  . Primary osteoarthritis involving multiple joints 10/03/2016  . Amaurosis fugax of right eye 06/29/2016  . Osteopenia of left thigh 03/30/2016  . Edema 03/01/2015  . Polyarthralgia 03/01/2015  . Rheumatoid arthritis involving multiple sites with positive rheumatoid factor (HCC) 03/01/2015  . Rheumatoid arthritis, unspecified (HCC) 03/01/2015    History reviewed. No pertinent surgical history.  Prior to Admission medications   Medication Sig Start Date End Date Taking? Authorizing Provider  amLODipine (NORVASC) 5 MG tablet Take by mouth. 11/28/18   [provider]  aspirin 81 MG chewable tablet Chew by mouth.    [provider]  atorvastatin (LIPITOR) 40 MG tablet Take by mouth. 04/11/19   [provider]  Cholecalciferol 50 MCG (2000 UT) CAPS Take by mouth.    [provider]  folic acid (FOLVITE) 1 MG tablet Take by mouth. 04/02/19   [provider]  furosemide (LASIX) 20 MG tablet Take by mouth. 11/28/18   [provider]  levothyroxine (SYNTHROID) 100 MCG tablet Take by mouth. 11/28/18 12/30/20  [provider]  meclizine (ANTIVERT) 25 MG tablet Take by mouth. 10/06/19   [provider]  meloxicam (MOBIC) 7.5 MG tablet Take by mouth. 04/02/19   [provider]  methotrexate (RHEUMATREX) 2.5 MG tablet Take by mouth. 07/29/20  [provider]  metoprolol tartrate (LOPRESSOR) 25 MG tablet Take by mouth. 02/25/19   [provider]  oxybutynin (DITROPAN) 5 MG tablet Take by mouth. 12/24/17 12/07/20   [provider]  pantoprazole (PROTONIX) 40 MG tablet Take by mouth. 05/24/20 08/22/20  [provider]  simvastatin (ZOCOR) 20 MG tablet  04/10/19   [provider]    Allergies Diltiazem hcl  History reviewed. No pertinent family history.  Social History Social History   Tobacco Use  . Smoking status: Current Every Day Smoker    Types: Cigarettes    Start date: 07/23/1995  . Smokeless tobacco: Never Used    Review of Systems  Constitutional: Negative for fever. Eyes: Negative for visual changes. ENT: Negative for sore throat. Neck: No neck pain  Cardiovascular: Negative for chest pain. Respiratory: Negative for shortness of breath. Gastrointestinal: Negative for abdominal pain, vomiting or diarrhea. Genitourinary: Negative for dysuria. Musculoskeletal: Negative for back pain. Skin: Negative for rash. Neurological: Negative for headaches, weakness or numbness. Psych: No SI or HI  ____________________________________________   PHYSICAL EXAM:  VITAL SIGNS: ED Triage Vitals  Enc Vitals Group     BP 09/11/20 2142 (!) 157/62     Pulse Rate 09/11/20 2142 86     Resp 09/11/20 2142 20     Temp 09/11/20 2142 98.3 F (36.8 C)     Temp Source 09/11/20 2142 Oral     SpO2 09/11/20 2142 99 %     Weight 09/11/20 2143 272 lb (123.4 kg)     Height 09/11/20 2143 5\' 1"  (1.549 m)     Head Circumference --      Peak Flow --      Pain Score 09/12/20 0436 0     Pain Loc --      Pain Edu? --      Excl. in GC? --     Constitutional: Alert and oriented. Well appearing and in no apparent distress. HEENT:      Head: Normocephalic and atraumatic.         Eyes: Conjunctivae are normal. Sclera is non-icteric.       Mouth/Throat: Mucous membranes are moist.       Neck: Supple with no signs of meningismus. Cardiovascular: Regular rate and rhythm. No murmurs, gallops, or rubs. 2+ symmetrical distal pulses are present in all extremities. No JVD. Respiratory:  Normal respiratory effort. Lungs are clear to auscultation bilaterally. No wheezes, crackles, or rhonchi.  Gastrointestinal: Soft, non tender, and non distended. Musculoskeletal:  No edema, cyanosis, or erythema of extremities. Neurologic: Normal speech and language. Face is symmetric. Moving all extremities. No gross focal neurologic deficits are appreciated. Skin: Skin is warm, dry and intact. No rash noted. Psychiatric: Mood and affect are normal. Speech and behavior are normal.  ____________________________________________   LABS (all labs ordered are listed, but only abnormal results are displayed)  Labs Reviewed  BASIC METABOLIC PANEL - Abnormal; Notable for the following components:      Result Value   Glucose, Bld 113 (*)    BUN 39 (*)    Creatinine, Ser 1.84 (*)    GFR, Estimated 29 (*)    All other components within normal limits  CBC - Abnormal; Notable for the following components:   RBC 3.81 (*)    All other components within normal limits  TROPONIN I (HIGH SENSITIVITY)  TROPONIN I (HIGH SENSITIVITY)   ____________________________________________  EKG  ED ECG REPORT I, 11/10/20, the attending physician, personally  viewed and interpreted this ECG.  Normal sinus rhythm, rate of 74, incomplete right bundle branch block, no ST elevations or depressions.  No prior for comparison ____________________________________________  RADIOLOGY  none  ____________________________________________   PROCEDURES  Procedure(s) performed:yes .1-3 Lead EKG Interpretation Performed by: Rudene Re, MD Authorized by: Rudene Re, MD     Interpretation: non-specific     ECG rate assessment: normal     Rhythm: sinus rhythm     Ectopy: none     Critical Care performed:  None ____________________________________________   INITIAL IMPRESSION / ASSESSMENT AND PLAN / ED COURSE  70 y.o. female with a history of GERD, venous insufficiency, hypertension,  hyperlipidemia, hypothyroidism, rheumatoid arthritis who presents for evaluation of hypertension. Patient asymptomatic. BP here 156/65. Neuro intact. Exam unrevealing. Labs showing mild elevation on the creatinine with GFR from 49 to 29 when compared to labs from 07/2020. Patient looks euvolemic. Will do an IV bolus challenge and repeat creatinine. Possibly dehydration vs worsening CKD. Will recommend reducing lasix to 20mg  every other day instead of daily and will refer patient to nephrology. EKG with no evidence of ischemia. HS-trop x 2 negative. Will give am doses of amlodipine and metoprolol. Will monitor on telemetry and reassess for disposition. Old medical records reviewed showing the patient's blood pressure has been elevated between 160 and 180 on all her most recent visits dating as far back as the beginning of last month.  Prior to that blood pressure seems to be normal with systolics in the 341P and 130s.  _________________________ 7:00 AM on 09/12/2020 ----------------------------------------- Care transferred to incoming MD.       _____________________________________________ Please note:  Patient was evaluated in Emergency Department today for the symptoms described in the history of present illness. Patient was evaluated in the context of the global COVID-19 pandemic, which necessitated consideration that the patient might be at risk for infection with the SARS-CoV-2 virus that causes COVID-19. Institutional protocols and algorithms that pertain to the evaluation of patients at risk for COVID-19 are in a state of rapid change based on information released by regulatory bodies including the CDC and federal and state organizations. These policies and algorithms were followed during the patient's care in the ED.  Some ED evaluations and interventions may be delayed as a result of limited staffing during the pandemic.   [place] Controlled Substance Database was reviewed by  me. ____________________________________________   FINAL CLINICAL IMPRESSION(S) / ED DIAGNOSES   Final diagnoses:  Primary hypertension  AKI (acute kidney injury) ([place])      NEW MEDICATIONS STARTED DURING THIS VISIT:  ED Discharge Orders    None       Note:  This document was prepared using Dragon voice recognition software and may include unintentional dictation errors.    Rudene Re, MD 09/12/20 [phone]

## 2020-09-12 NOTE — ED Provider Notes (Signed)
Patient received in signout from Dr. Don Perking pending repeat blood work.  Repeat BMP after fluid significantly improved.  Plan will be to have patient follow-up with PCP for repeat blood work this coming week.  Have also instructed her to decrease her leave Lasix temporarily.  Will give referral to nephrology.  Patient agreeable plan.   Willy Eddy, MD 09/12/20 [phone]

## 2020-09-14 ENCOUNTER — Other Ambulatory Visit: Payer: Self-pay

## 2020-09-14 ENCOUNTER — Ambulatory Visit (INDEPENDENT_AMBULATORY_CARE_PROVIDER_SITE_OTHER): Payer: Medicare HMO | Admitting: Vascular Surgery

## 2020-09-14 VITALS — BP 175/76 | HR 85 | Resp 16 | Ht 61.0 in | Wt 285.0 lb

## 2020-09-14 DIAGNOSIS — R609 Edema, unspecified: Secondary | ICD-10-CM

## 2020-09-14 NOTE — Progress Notes (Signed)
History of Present Illness  There is no documented history at this time  Assessments & Plan   There are no diagnoses linked to this encounter.    Additional instructions  Subjective:  Patient presents with venous ulcer of Bilateral lower extremities.    Procedure:  3 layer unna wrap was placed on Bilateral lower extremities.   Plan:   Follow up in one week. 

## 2020-09-16 ENCOUNTER — Other Ambulatory Visit: Payer: Self-pay | Admitting: Nephrology

## 2020-09-16 ENCOUNTER — Other Ambulatory Visit (HOSPITAL_COMMUNITY): Payer: Self-pay | Admitting: Nephrology

## 2020-09-16 DIAGNOSIS — T8619 Other complication of kidney transplant: Secondary | ICD-10-CM

## 2020-09-16 DIAGNOSIS — R809 Proteinuria, unspecified: Secondary | ICD-10-CM

## 2020-09-16 DIAGNOSIS — R8290 Unspecified abnormal findings in urine: Secondary | ICD-10-CM

## 2020-09-16 DIAGNOSIS — I10 Essential (primary) hypertension: Secondary | ICD-10-CM

## 2020-09-16 DIAGNOSIS — N179 Acute kidney failure, unspecified: Secondary | ICD-10-CM

## 2020-09-16 DIAGNOSIS — E663 Overweight: Secondary | ICD-10-CM

## 2020-09-17 ENCOUNTER — Other Ambulatory Visit
Admission: RE | Admit: 2020-09-17 | Discharge: 2020-09-17 | Disposition: A | Discharge status: Home / Self Care | Payer: Medicare HMO | Source: Ambulatory Visit | Attending: Student | Admitting: Student

## 2020-09-17 DIAGNOSIS — Z03818 Encounter for observation for suspected exposure to other biological agents ruled out: Secondary | ICD-10-CM | POA: Diagnosis not present

## 2020-09-17 DIAGNOSIS — R0602 Shortness of breath: Secondary | ICD-10-CM | POA: Diagnosis not present

## 2020-09-17 DIAGNOSIS — I10 Essential (primary) hypertension: Secondary | ICD-10-CM | POA: Diagnosis not present

## 2020-09-17 DIAGNOSIS — J22 Unspecified acute lower respiratory infection: Secondary | ICD-10-CM | POA: Diagnosis not present

## 2020-09-17 DIAGNOSIS — I890 Lymphedema, not elsewhere classified: Secondary | ICD-10-CM | POA: Diagnosis not present

## 2020-09-17 DIAGNOSIS — R059 Cough, unspecified: Secondary | ICD-10-CM | POA: Diagnosis not present

## 2020-09-17 LAB — BRAIN NATRIURETIC PEPTIDE: B Natriuretic Peptide: 329.5 pg/mL — ABNORMAL HIGH (ref 0.0–100.0)

## 2020-09-17 LAB — D-DIMER, QUANTITATIVE: D-Dimer, Quant: 2.09 ug/mL-FEU — ABNORMAL HIGH (ref 0.00–0.50)

## 2020-09-18 ENCOUNTER — Encounter: Payer: Self-pay | Admitting: Intensive Care

## 2020-09-18 ENCOUNTER — Emergency Department: Payer: Medicare HMO

## 2020-09-18 ENCOUNTER — Other Ambulatory Visit: Payer: Self-pay

## 2020-09-18 ENCOUNTER — Emergency Department
Admission: EM | Admit: 2020-09-18 | Discharge: 2020-09-18 | Disposition: A | Discharge status: Home / Self Care | Payer: Medicare HMO | Attending: Emergency Medicine | Admitting: Emergency Medicine

## 2020-09-18 DIAGNOSIS — J189 Pneumonia, unspecified organism: Secondary | ICD-10-CM | POA: Diagnosis not present

## 2020-09-18 DIAGNOSIS — R0602 Shortness of breath: Secondary | ICD-10-CM | POA: Diagnosis not present

## 2020-09-18 DIAGNOSIS — Z79899 Other long term (current) drug therapy: Secondary | ICD-10-CM | POA: Insufficient documentation

## 2020-09-18 DIAGNOSIS — I517 Cardiomegaly: Secondary | ICD-10-CM | POA: Diagnosis not present

## 2020-09-18 DIAGNOSIS — Z87891 Personal history of nicotine dependence: Secondary | ICD-10-CM | POA: Insufficient documentation

## 2020-09-18 DIAGNOSIS — Z7982 Long term (current) use of aspirin: Secondary | ICD-10-CM | POA: Diagnosis not present

## 2020-09-18 DIAGNOSIS — E039 Hypothyroidism, unspecified: Secondary | ICD-10-CM | POA: Insufficient documentation

## 2020-09-18 DIAGNOSIS — I10 Essential (primary) hypertension: Secondary | ICD-10-CM | POA: Diagnosis not present

## 2020-09-18 DIAGNOSIS — I2699 Other pulmonary embolism without acute cor pulmonale: Secondary | ICD-10-CM | POA: Diagnosis not present

## 2020-09-18 DIAGNOSIS — R059 Cough, unspecified: Secondary | ICD-10-CM | POA: Diagnosis not present

## 2020-09-18 HISTORY — DX: Disorder of thyroid, unspecified: E07.9

## 2020-09-18 LAB — BASIC METABOLIC PANEL
Anion gap: 11 (ref 5–15)
BUN: 18 mg/dL (ref 8–23)
CO2: 27 mmol/L (ref 22–32)
Calcium: 9.5 mg/dL (ref 8.9–10.3)
Chloride: 99 mmol/L (ref 98–111)
Creatinine, Ser: 0.97 mg/dL (ref 0.44–1.00)
GFR, Estimated: >60 mL/min (ref >60)
Glucose, Bld: 107 mg/dL — ABNORMAL HIGH (ref 70–99)
Potassium: 3.6 mmol/L (ref 3.5–5.1)
Sodium: 137 mmol/L (ref 135–145)

## 2020-09-18 LAB — CBC
HCT: 37.2 % (ref 36.0–46.0)
Hemoglobin: 12 g/dL (ref 12.0–15.0)
MCH: 32 pg (ref 26.0–34.0)
MCHC: 32.3 g/dL (ref 30.0–36.0)
MCV: 99.2 fL (ref 80.0–100.0)
Platelets: 254 10*3/uL (ref 150–400)
RBC: 3.75 MIL/uL — ABNORMAL LOW (ref 3.87–5.11)
RDW: 15.1 % (ref 11.5–15.5)
WBC: 6.7 10*3/uL (ref 4.0–10.5)
nRBC: 0 % (ref 0.0–0.2)

## 2020-09-18 LAB — D-DIMER, QUANTITATIVE: D-Dimer, Quant: 2.26 ug/mL-FEU — ABNORMAL HIGH (ref 0.00–0.50)

## 2020-09-18 MED ORDER — AMOXICILLIN-POT CLAVULANATE 875-125 MG PO TABS: 1.0000 | ORAL_TABLET | Freq: Two times a day (BID) | ORAL | 0 refills | Status: DC

## 2020-09-18 MED ORDER — IOHEXOL 350 MG/ML SOLN
100.0000 mL | Freq: Once | INTRAVENOUS | Status: AC | PRN
  Administered 2020-09-18: 100 mL via INTRAVENOUS
  Filled 2020-09-18: qty 100

## 2020-09-18 MED ORDER — ONDANSETRON 4 MG PO TBDP: 4.0000 mg | ORAL_TABLET | Freq: Three times a day (TID) | ORAL | 0 refills | Status: AC | PRN

## 2020-09-18 NOTE — ED Provider Notes (Signed)
[facility] Emergency Department Provider Note  ____________________________________________  Time seen: Approximately 7:59 PM  I have reviewed the triage vital signs and the nursing notes.   HISTORY  Chief Complaint Cough and Shortness of Breath    HPI Kristen Marquez is a 70 y.o. female with a history of hypertension hyperlipidemia and 2 prior unprovoked DVTs many years ago who comes the ED complaining of shortness of breath, worse with exertion, better with rest, ongoing for the past 2 weeks, gradual onset and constant.  Now more recently developed nonproductive cough as well.  Denies chest pain.  No fevers chills body aches or fatigue.  No vomiting diarrhea, eating and drinking normally.  Patient went to [facility] clinic walk-in clinic today, had a positive D-dimer of 2.0 and was sent to the ED for further evaluation.  She also had a rapid antigen COVID test which was negative, and reports that COVID PCR and strep PCR were sent out to [facility], results not available on those yet.  Denies any known COVID exposure.   Patient also notes that she has multiple family members who have had problems with blood clots including an uncle, cousin, and a sister.  She has not been worked up for any hypercoagulable disorder in the past.  She has a appointment to establish herself with a new PCP on Monday.   Past Medical History:  Diagnosis Date  . Arthritis   . High cholesterol   . Hypertension   . Thyroid disease      Patient Active Problem List   Diagnosis Date Noted  . Gastroesophageal reflux disease 03/17/2020  . Venous (peripheral) insufficiency 03/17/2020  . Chronic bilateral low back pain without sciatica 01/07/2020  . Benign essential hypertension 12/31/2019  . BMI 45.0-49.9, adult (HCC) 12/31/2019  . DDD (degenerative disc disease), lumbosacral 12/31/2019  . Dependent edema 12/31/2019  . Heterozygous protein S deficiency (HCC) 12/31/2019  . Hyperlipidemia  12/31/2019  . Hypertonicity of bladder 12/31/2019  . Hypothyroidism 12/31/2019  . Lymphedema, not elsewhere classified 12/31/2019  . Osteoporosis 12/31/2019  . Other long term (current) drug therapy 12/31/2019  . Vitamin D deficiency 12/31/2019  . Weight gain 12/31/2019  . Neuropathic pain 04/16/2019  . Varicose veins of leg with edema, bilateral 03/07/2019  . Long-term use of high-risk medication 10/03/2016  . Primary osteoarthritis involving multiple joints 10/03/2016  . Amaurosis fugax of right eye 06/29/2016  . Osteopenia of left thigh 03/30/2016  . Edema 03/01/2015  . Polyarthralgia 03/01/2015  . Rheumatoid arthritis involving multiple sites with positive rheumatoid factor (HCC) 03/01/2015  . Rheumatoid arthritis, unspecified (HCC) 03/01/2015     History reviewed. No pertinent surgical history.   Prior to Admission medications   Medication Sig Start Date End Date Taking? Authorizing Provider  amoxicillin-clavulanate (AUGMENTIN) 875-125 MG tablet Take 1 tablet by mouth 2 (two) times daily. 09/18/20  Yes Sharman Cheek, MD  ondansetron (ZOFRAN ODT) 4 MG disintegrating tablet Take 1 tablet (4 mg total) by mouth every 8 (eight) hours as needed for nausea or vomiting. 09/18/20  Yes Sharman Cheek, MD  amLODipine (NORVASC) 5 MG tablet Take by mouth. 11/28/18   [provider]  aspirin 81 MG chewable tablet Chew by mouth.    [provider]  atorvastatin (LIPITOR) 40 MG tablet Take by mouth. 04/11/19   [provider]  Cholecalciferol 50 MCG (2000 UT) CAPS Take by mouth.    [provider]  folic acid (FOLVITE) 1 MG tablet Take by mouth. 04/02/19  [provider]  furosemide (LASIX) 20 MG tablet Take by mouth. 11/28/18   [provider]  levothyroxine (SYNTHROID) 100 MCG tablet Take by mouth. 11/28/18 12/30/20  [provider]  meclizine (ANTIVERT) 25 MG tablet Take by mouth. 10/06/19   [provider]  meloxicam  (MOBIC) 7.5 MG tablet Take by mouth. 04/02/19   [provider]  methotrexate (RHEUMATREX) 2.5 MG tablet Take by mouth. 07/29/20   [provider]  metoprolol tartrate (LOPRESSOR) 25 MG tablet Take by mouth. 02/25/19   [provider]  oxybutynin (DITROPAN) 5 MG tablet Take by mouth. 12/24/17 12/07/20  [provider]  pantoprazole (PROTONIX) 40 MG tablet Take by mouth. 05/24/20 08/22/20  [provider]  simvastatin (ZOCOR) 20 MG tablet  04/10/19   [provider]     Allergies Diltiazem hcl   History reviewed. No pertinent family history.  Social History Social History   Tobacco Use  . Smoking status: Former Smoker    Types: Cigarettes    Start date: 07/23/1995  . Smokeless tobacco: Never Used  Substance Use Topics  . Alcohol use: Yes  . Drug use: Never    Review of Systems  Constitutional:   No fever or chills.  ENT:   No sore throat. No rhinorrhea. Cardiovascular:   No chest pain or syncope. Respiratory: Positive shortness of breath and nonproductive cough. Gastrointestinal:   Negative for abdominal pain, vomiting and diarrhea.  Musculoskeletal:   Negative for focal pain or swelling All other systems reviewed and are negative except as documented above in ROS and HPI.  ____________________________________________   PHYSICAL EXAM:  VITAL SIGNS: ED Triage Vitals  Enc Vitals Group     BP 09/18/20 1431 (!) 157/58     Pulse Rate 09/18/20 1431 86     Resp 09/18/20 1431 20     Temp 09/18/20 1431 98.7 F (37.1 C)     Temp Source 09/18/20 1431 Oral     SpO2 09/18/20 1431 94 %     Weight 09/18/20 1444 280 lb (127 kg)     Height 09/18/20 1444  (1.549 m)     Head Circumference --      Peak Flow --      Pain Score 09/18/20 1443 8     Pain Loc --      Pain Edu? --      Excl. in GC? --     Vital signs reviewed, nursing assessments reviewed.   Constitutional:   Alert and oriented. Non-toxic appearance. Eyes:    Conjunctivae are normal. EOMI. PERRL. ENT      Head:   Normocephalic and atraumatic.      Nose:   Wearing a mask.      Mouth/Throat:   Wearing a mask.      Neck:   No meningismus. Full ROM. Hematological/Lymphatic/Immunilogical:   No cervical lymphadenopathy. Cardiovascular:   RRR. Symmetric bilateral radial and DP pulses.  No murmurs. Cap refill less than 2 seconds. Respiratory:   Normal respiratory effort without tachypnea/retractions.  Faint diffuse crackles without wheezes.  Symmetric air movement in all lung fields. Gastrointestinal:   Soft and nontender. Non distended. There is no CVA tenderness.  No rebound, rigidity, or guarding.  Musculoskeletal:   Normal range of motion in all extremities. No joint effusions.  Chronic symmetric lymphedema bilateral lower extremities, unchanged from baseline according to patient.  She follows vascular clinic and receives [employer] wraps. Neurologic:   Normal speech and language.  Motor grossly intact. No acute focal neurologic deficits are appreciated.  Skin:    Skin is warm, dry and intact. No rash noted.  No petechiae, purpura, or bullae.  ____________________________________________    LABS (pertinent positives/negatives) (all labs ordered are listed, but only abnormal results are displayed) Labs Reviewed  BASIC METABOLIC PANEL - Abnormal; Notable for the following components:      Result Value   Glucose, Bld 107 (*)    All other components within normal limits  CBC - Abnormal; Notable for the following components:   RBC 3.75 (*)    All other components within normal limits  D-DIMER, QUANTITATIVE (NOT AT [facility]) - Abnormal; Notable for the following components:   D-Dimer, Quant 2.26 (*)    All other components within normal limits   ____________________________________________   EKG    ____________________________________________    RADIOLOGY  CT Angio Chest PE W and/or Wo Contrast  Result Date: 09/18/2020 CLINICAL DATA:   Cough and hoarseness. Shortness of breath with exertion. Question pulmonary emboli. EXAM: CT ANGIOGRAPHY CHEST WITH CONTRAST TECHNIQUE: Multidetector CT imaging of the chest was performed using the standard protocol during bolus administration of intravenous contrast. Multiplanar CT image reconstructions and MIPs were obtained to evaluate the vascular anatomy. CONTRAST:  OMNIPAQUE IOHEXOL 350 MG/ML SOLN COMPARISON:  None. FINDINGS: Cardiovascular: Cardiomegaly. Coronary artery calcification is present. Aortic atherosclerotic calcification is present. Pulmonary arterial opacification is good. There are no pulmonary emboli. Mediastinum/Nodes: No mediastinal or hilar mass or lymphadenopathy. Lungs/Pleura: Patchy pulmonary infiltrates in the mid and lower lungs bilaterally, more marked on the right than on the left, consistent with bronchopneumonia. Pattern is more typical of bacterial than viral pneumonia. Question of the study shows pulmonary venous hypertension and early interstitial edema in addition. Upper Abdomen: Negative Musculoskeletal: Long segment ankylosis of the thoracic spine suggesting diffuse idiopathic skeletal hyperostosis. No regional fracture. Review of the MIP images confirms the above findings. IMPRESSION: 1. No pulmonary emboli. 2. Patchy pulmonary infiltrates in the mid and lower lungs bilaterally, more marked on the right than on the left, consistent with bronchopneumonia. Pattern is more typical of bacterial than viral pneumonia. 3. Question of pulmonary venous hypertension and early interstitial edema in addition to the infectious findings. 4. Cardiomegaly. Coronary artery calcification. 5. Aortic atherosclerosis. Aortic Atherosclerosis (ICD10-I70.0). Electronically Signed   By: Paulina Fusi M.D.   On: 09/18/2020 20:20    ____________________________________________   PROCEDURES Procedures  ____________________________________________  DIFFERENTIAL DIAGNOSIS   Pulmonary  embolism, viral illness, bronchitis, COVID, pleural effusion  CLINICAL IMPRESSION / ASSESSMENT AND PLAN / ED COURSE  Medications ordered in the ED: Medications  iohexol (OMNIPAQUE) 350 MG/ML injection 100 mL (100 mLs Intravenous Contrast Given 09/18/20 2008)    Pertinent labs & imaging results that were available during my care of the patient were reviewed by me and considered in my medical decision making (see chart for details).  Lysbeth Dicola was evaluated in Emergency Department on 09/18/2020 for the symptoms described in the history of present illness. She was evaluated in the context of the global COVID-19 pandemic, which necessitated consideration that the patient might be at risk for infection with the SARS-CoV-2 virus that causes COVID-19. Institutional protocols and algorithms that pertain to the evaluation of patients at risk for COVID-19 are in a state of rapid change based on information released by regulatory bodies including the CDC and federal and state organizations. These policies and algorithms were followed during the patient's care in the ED.   Patient  presents with shortness of breath and cough for the past 2 weeks.  By history she has elevated risk of VTE including 2 prior unprovoked DVTs and strong family history.  Vital signs are normal.  Rapid COVID was negative per EMR.  D-dimer was elevated.  We will need to obtain CT angiogram of the chest to evaluate for PE versus occult pulmonary pathology.  Clinical Course as of 09/18/20 2040  Sat Sep 18, 2020  2037 CT negative for PE.  Does show multifocal infiltrates with appearance most consistent with bacterial pneumonia.  She was started on a Z-Pak yesterday and has taken it yesterday and today.  Encouraged her to continue taking this, and I will provide her a wait-and-see prescription for Augmentin in case symptoms are not improving by the time she completes the Z-Pak course.  Vital signs here are normal, not hypoxic, stable for  outpatient follow-up. [PS]    Clinical Course User Index [PS] Sharman Cheek, MD     ____________________________________________   FINAL CLINICAL IMPRESSION(S) / ED DIAGNOSES    Final diagnoses:  Walking pneumonia     ED Discharge Orders         Ordered    amoxicillin-clavulanate (AUGMENTIN) 875-125 MG tablet  2 times daily        09/18/20 2040    ondansetron (ZOFRAN ODT) 4 MG disintegrating tablet  Every 8 hours PRN        09/18/20 2040          Portions of this note were generated with dragon dictation software. Dictation errors may occur despite best attempts at proofreading.   Sharman Cheek, MD 09/18/20 2041

## 2020-09-18 NOTE — Discharge Instructions (Signed)
Results from labs and CT scan are listed below:  Results for orders placed or performed during the hospital encounter of 09/18/20  Basic metabolic panel  Result Value Ref Range   Sodium 137 135 - 145 mmol/L   Potassium 3.6 3.5 - 5.1 mmol/L   Chloride 99 98 - 111 mmol/L   CO2 27 22 - 32 mmol/L   Glucose, Bld 107 (H) 70 - 99 mg/dL   BUN 18 8 - 23 mg/dL   Creatinine, Ser 1.32 0.44 - 1.00 mg/dL   Calcium 9.5 8.9 - 44.0 mg/dL   GFR, Estimated >10 >27 mL/min   Anion gap 11 5 - 15  CBC  Result Value Ref Range   WBC 6.7 4.0 - 10.5 K/uL   RBC 3.75 (L) 3.87 - 5.11 MIL/uL   Hemoglobin 12.0 12.0 - 15.0 g/dL   HCT 25.3 66.4 - 40.3 %   MCV 99.2 80.0 - 100.0 fL   MCH 32.0 26.0 - 34.0 pg   MCHC 32.3 30.0 - 36.0 g/dL   RDW 47.4 25.9 - 56.3 %   Platelets 254 150 - 400 K/uL   nRBC 0.0 0.0 - 0.2 %  D-dimer, quantitative (not at [facility])  Result Value Ref Range   D-Dimer, Quant 2.26 (H) 0.00 - 0.50 ug/mL-FEU   CT Angio Chest PE W and/or Wo Contrast  Result Date: 09/18/2020 CLINICAL DATA:  Cough and hoarseness. Shortness of breath with exertion. Question pulmonary emboli. EXAM: CT ANGIOGRAPHY CHEST WITH CONTRAST TECHNIQUE: Multidetector CT imaging of the chest was performed using the standard protocol during bolus administration of intravenous contrast. Multiplanar CT image reconstructions and MIPs were obtained to evaluate the vascular anatomy. CONTRAST:  OMNIPAQUE IOHEXOL 350 MG/ML SOLN COMPARISON:  None. FINDINGS: Cardiovascular: Cardiomegaly. Coronary artery calcification is present. Aortic atherosclerotic calcification is present. Pulmonary arterial opacification is good. There are no pulmonary emboli. Mediastinum/Nodes: No mediastinal or hilar mass or lymphadenopathy. Lungs/Pleura: Patchy pulmonary infiltrates in the mid and lower lungs bilaterally, more marked on the right than on the left, consistent with bronchopneumonia. Pattern is more typical of bacterial than viral pneumonia. Question of  the study shows pulmonary venous hypertension and early interstitial edema in addition. Upper Abdomen: Negative Musculoskeletal: Long segment ankylosis of the thoracic spine suggesting diffuse idiopathic skeletal hyperostosis. No regional fracture. Review of the MIP images confirms the above findings. IMPRESSION: 1. No pulmonary emboli. 2. Patchy pulmonary infiltrates in the mid and lower lungs bilaterally, more marked on the right than on the left, consistent with bronchopneumonia. Pattern is more typical of bacterial than viral pneumonia. 3. Question of pulmonary venous hypertension and early interstitial edema in addition to the infectious findings. 4. Cardiomegaly. Coronary artery calcification. 5. Aortic atherosclerosis. Aortic Atherosclerosis (ICD10-I70.0). Electronically Signed   By: Paulina Fusi M.D.   On: 09/18/2020 20:20

## 2020-09-18 NOTE — ED Triage Notes (Addendum)
Patient presents with cough that started 09/09/20 and 09/17/20 hoarseness sob with exertion started. Patient was given antibiotics and cough medicine yesterday at [facility]. Pt reports negative rapid covid and throat swab sent off to [facility] with no results yet. Patient sent here by [facility] today for elevated d-dimer. Patient reports having chest xray yesterday at [facility]

## 2020-09-19 NOTE — Progress Notes (Signed)
Subjective:    Patient ID: Kristen Marquez, female    DOB: [birth year], 70 y.o.   MRN: [id number] Chief Complaint  Patient presents with  . Follow-up    Unna wrap check    The patient returns to the office for followup evaluation regarding leg swelling.  The swelling has persisted and the pain associated with swelling continues. There have not been any interval development of a ulcerations or wounds.  Since the previous visit the patient has been wearing graduated compression stockings and has noted little if any improvement in the lymphedema. The patient has been using compression routinely morning until night.  The patient also states elevation during the day and exercise is being done too.    Review of Systems  Cardiovascular: Positive for leg swelling.  All other systems reviewed and are negative.      Objective:   Physical Exam Vitals reviewed.  HENT:     Head: Normocephalic.  Cardiovascular:     Rate and Rhythm: Normal rate.     Pulses: Normal pulses.  Pulmonary:     Effort: Pulmonary effort is normal.  Musculoskeletal:     Right lower leg: Edema present.     Left lower leg: Edema present.  Skin:    General: Skin is warm and dry.  Neurological:     Mental Status: She is alert and oriented to person, place, and time.  Psychiatric:        Mood and Affect: Mood normal.        Behavior: Behavior normal.        Thought Content: Thought content normal.        Judgment: Judgment normal.     BP (!) 165/86 (BP Location: Right Arm)   Pulse 82   Resp 16   Wt 272 lb (123.4 kg)   BMI 51.39 kg/m   Past Medical History:  Diagnosis Date  . Arthritis   . High cholesterol   . Hypertension   . Thyroid disease     Social History   Socioeconomic History  . Marital status: Married    Spouse name: Not on file  . Number of children: Not on file  . Years of education: Not on file  . Highest education level: Not on file  Occupational History  . Not on file  Tobacco  Use  . Smoking status: Former Smoker    Types: Cigarettes    Start date: 07/23/1995  . Smokeless tobacco: Never Used  Substance and Sexual Activity  . Alcohol use: Yes  . Drug use: Never  . Sexual activity: Not on file  Other Topics Concern  . Not on file  Social History Narrative  . Not on file   Social Determinants of Health   Financial Resource Strain: Not on file  Food Insecurity: Not on file  Transportation Needs: Not on file  Physical Activity: Not on file  Stress: Not on file  Social Connections: Not on file  Intimate Partner Violence: Not on file    History reviewed. No pertinent surgical history.  History reviewed. No pertinent family history.  Allergies  Allergen Reactions  . Diltiazem Hcl Other (See Comments)    Headache Headache     CBC Latest Ref Rng & Units 09/18/2020 09/11/2020  WBC 4.0 - 10.5 K/uL 6.7 7.7  Hemoglobin 12.0 - 15.0 g/dL 56.3 14.9  Hematocrit 70.2 - 46.0 % 37.2 37.6  Platelets 150 - 400 K/uL 254 242      CMP  Component Value Date/Time   NA 137 09/18/2020 1449   K 3.6 09/18/2020 1449   CL 99 09/18/2020 1449   CO2 27 09/18/2020 1449   GLUCOSE 107 (H) 09/18/2020 1449   BUN 18 09/18/2020 1449   CREATININE 0.97 09/18/2020 1449   CALCIUM 9.5 09/18/2020 1449   GFRNONAA >60 09/18/2020 1449     No results found.     Assessment & Plan:   1. Lymphedema No surgery or intervention at this point in time.    I have had a long discussion with the patient regarding venous insufficiency and why it  causes symptoms, specifically venous ulceration . I have discussed with the patient the chronic skin changes that accompany venous insufficiency and the long term sequela such as infection and recurring  ulceration.  Patient will be placed in [employer] which will be changed weekly drainage permitting.  In addition, behavioral modification including several periods of elevation of the lower extremities during the day will be continued.  Achieving a position with the ankles at heart level was stressed to the patient  The patient is instructed to begin routine exercise, especially walking on a daily basis  Patient will continue to have wraps placed on a weekly basis.  His wraps will be changed weekly drainage or standing and the patient will return to the office in 4 weeks for reevaluation. 2. Benign essential hypertension Continue antihypertensive medications as already ordered, these medications have been reviewed and there are no changes at this time.   3. Hyperlipidemia, unspecified hyperlipidemia type Continue statin as ordered and reviewed, no changes at this time    Current Outpatient Medications on File Prior to Visit  Medication Sig Dispense Refill  . amLODipine (NORVASC) 5 MG tablet Take by mouth.    Marland Kitchen aspirin 81 MG chewable tablet Chew by mouth.    Marland Kitchen atorvastatin (LIPITOR) 40 MG tablet Take by mouth.    . Cholecalciferol 50 MCG (2000 UT) CAPS Take by mouth.    . folic acid (FOLVITE) 1 MG tablet Take by mouth.    . furosemide (LASIX) 20 MG tablet Take by mouth.    . levothyroxine (SYNTHROID) 100 MCG tablet Take by mouth.    . meclizine (ANTIVERT) 25 MG tablet Take by mouth.    . meloxicam (MOBIC) 7.5 MG tablet Take by mouth.    . methotrexate (RHEUMATREX) 2.5 MG tablet Take by mouth.    . metoprolol tartrate (LOPRESSOR) 25 MG tablet Take by mouth.    . oxybutynin (DITROPAN) 5 MG tablet Take by mouth.    . simvastatin (ZOCOR) 20 MG tablet     . pantoprazole (PROTONIX) 40 MG tablet Take by mouth.     No current facility-administered medications on file prior to visit.    There are no Patient Instructions on file for this visit. No follow-ups on file.   Georgiana Spinner, NP

## 2020-09-20 ENCOUNTER — Encounter (INDEPENDENT_AMBULATORY_CARE_PROVIDER_SITE_OTHER): Payer: Medicare HMO

## 2020-09-21 ENCOUNTER — Ambulatory Visit (INDEPENDENT_AMBULATORY_CARE_PROVIDER_SITE_OTHER): Payer: Medicare HMO | Admitting: Nurse Practitioner

## 2020-09-22 DIAGNOSIS — I890 Lymphedema, not elsewhere classified: Secondary | ICD-10-CM | POA: Diagnosis not present

## 2020-09-22 DIAGNOSIS — R059 Cough, unspecified: Secondary | ICD-10-CM | POA: Diagnosis not present

## 2020-09-22 DIAGNOSIS — I10 Essential (primary) hypertension: Secondary | ICD-10-CM | POA: Diagnosis not present

## 2020-09-22 DIAGNOSIS — N179 Acute kidney failure, unspecified: Secondary | ICD-10-CM | POA: Diagnosis not present

## 2020-09-22 DIAGNOSIS — M069 Rheumatoid arthritis, unspecified: Secondary | ICD-10-CM | POA: Diagnosis not present

## 2020-09-22 DIAGNOSIS — D6859 Other primary thrombophilia: Secondary | ICD-10-CM | POA: Diagnosis not present

## 2020-09-22 DIAGNOSIS — E039 Hypothyroidism, unspecified: Secondary | ICD-10-CM | POA: Diagnosis not present

## 2020-09-22 DIAGNOSIS — R609 Edema, unspecified: Secondary | ICD-10-CM | POA: Diagnosis not present

## 2020-09-23 ENCOUNTER — Ambulatory Visit
Admission: RE | Admit: 2020-09-23 | Discharge: 2020-09-23 | Disposition: A | Discharge status: Home / Self Care | Payer: Medicare HMO | Source: Ambulatory Visit | Attending: Nephrology | Admitting: Nephrology

## 2020-09-23 ENCOUNTER — Ambulatory Visit (INDEPENDENT_AMBULATORY_CARE_PROVIDER_SITE_OTHER): Payer: Medicare HMO | Admitting: Nurse Practitioner

## 2020-09-23 ENCOUNTER — Other Ambulatory Visit: Payer: Self-pay

## 2020-09-23 DIAGNOSIS — E663 Overweight: Secondary | ICD-10-CM | POA: Diagnosis not present

## 2020-09-23 DIAGNOSIS — R8290 Unspecified abnormal findings in urine: Secondary | ICD-10-CM | POA: Insufficient documentation

## 2020-09-23 DIAGNOSIS — N179 Acute kidney failure, unspecified: Secondary | ICD-10-CM | POA: Insufficient documentation

## 2020-09-23 DIAGNOSIS — I10 Essential (primary) hypertension: Secondary | ICD-10-CM | POA: Diagnosis not present

## 2020-09-23 DIAGNOSIS — R809 Proteinuria, unspecified: Secondary | ICD-10-CM | POA: Insufficient documentation

## 2020-09-23 DIAGNOSIS — T8619 Other complication of kidney transplant: Secondary | ICD-10-CM | POA: Insufficient documentation

## 2020-09-24 ENCOUNTER — Encounter (INDEPENDENT_AMBULATORY_CARE_PROVIDER_SITE_OTHER): Payer: Self-pay | Admitting: Nurse Practitioner

## 2020-09-24 ENCOUNTER — Ambulatory Visit (INDEPENDENT_AMBULATORY_CARE_PROVIDER_SITE_OTHER): Payer: Medicare HMO | Admitting: Nurse Practitioner

## 2020-09-24 VITALS — BP 147/78 | HR 89 | Resp 16 | Wt 276.0 lb

## 2020-09-24 DIAGNOSIS — I10 Essential (primary) hypertension: Secondary | ICD-10-CM

## 2020-09-24 DIAGNOSIS — L03115 Cellulitis of right lower limb: Secondary | ICD-10-CM

## 2020-09-24 DIAGNOSIS — I890 Lymphedema, not elsewhere classified: Secondary | ICD-10-CM

## 2020-09-24 DIAGNOSIS — E785 Hyperlipidemia, unspecified: Secondary | ICD-10-CM | POA: Diagnosis not present

## 2020-09-24 MED ORDER — DOXYCYCLINE HYCLATE 100 MG PO CAPS: 100.0000 mg | ORAL_CAPSULE | Freq: Two times a day (BID) | ORAL | 0 refills | Status: DC

## 2020-09-28 DIAGNOSIS — R059 Cough, unspecified: Secondary | ICD-10-CM | POA: Diagnosis not present

## 2020-09-28 DIAGNOSIS — Z03818 Encounter for observation for suspected exposure to other biological agents ruled out: Secondary | ICD-10-CM | POA: Diagnosis not present

## 2020-09-29 ENCOUNTER — Encounter (INDEPENDENT_AMBULATORY_CARE_PROVIDER_SITE_OTHER): Payer: Self-pay | Admitting: Nurse Practitioner

## 2020-09-29 NOTE — Progress Notes (Signed)
Subjective:    Patient ID: Kristen Marquez, female    DOB: [birth year], 70 y.o.   MRN: [id number] Chief Complaint  Patient presents with  . Follow-up    Unna wrap check    Patient returns today for evaluation of lower extremity edema in relation to lymphedema.  The patient was previously on the wraps however due to illness and discomfort she had her move her generally.  She has not been in her wraps for nearly 2 weeks.  Due to not being in wraps the patient has developed cellulitis of her right lower extremity.  She notes that it is sore and tender to the touch.  She denies any fever, chills, nausea, vomiting or diarrhea.  However she also notes that she recently was fitted for compression wraps and is awaiting their arrival.   Review of Systems  Cardiovascular: Positive for leg swelling.  Skin: Positive for wound.  All other systems reviewed and are negative.      Objective:   Physical Exam Vitals reviewed.  Cardiovascular:     Rate and Rhythm: Normal rate.     Pulses: Normal pulses.  Pulmonary:     Effort: Pulmonary effort is normal.  Musculoskeletal:     Right lower leg: Edema present.     Left lower leg: Edema present.  Neurological:     Mental Status: She is alert and oriented to person, place, and time.  Psychiatric:        Mood and Affect: Mood normal.        Behavior: Behavior normal.        Thought Content: Thought content normal.        Judgment: Judgment normal.     BP (!) 147/78 (BP Location: Left Arm)   Pulse 89   Resp 16   Wt 276 lb (125.2 kg)   BMI 52.15 kg/m   Past Medical History:  Diagnosis Date  . Arthritis   . High cholesterol   . Hypertension   . Thyroid disease     Social History   Socioeconomic History  . Marital status: Married    Spouse name: Not on file  . Number of children: Not on file  . Years of education: Not on file  . Highest education level: Not on file  Occupational History  . Not on file  Tobacco Use  . Smoking status:  Former Smoker    Types: Cigarettes    Start date: 07/23/1995  . Smokeless tobacco: Never Used  Substance and Sexual Activity  . Alcohol use: Yes  . Drug use: Never  . Sexual activity: Not on file  Other Topics Concern  . Not on file  Social History Narrative  . Not on file   Social Determinants of Health   Financial Resource Strain: Not on file  Food Insecurity: Not on file  Transportation Needs: Not on file  Physical Activity: Not on file  Stress: Not on file  Social Connections: Not on file  Intimate Partner Violence: Not on file    History reviewed. No pertinent surgical history.  History reviewed. No pertinent family history.  Allergies  Allergen Reactions  . Diltiazem     Other reaction(s): Headache, Other (see comments) Other reaction(s): Other (See Comments) Headache Headache Headache Headache Severe headache   . Diltiazem Hcl Other (See Comments)    Headache Headache     CBC Latest Ref Rng & Units 09/18/2020 09/11/2020  WBC 4.0 - 10.5 K/uL 6.7 7.7  Hemoglobin 12.0 -  15.0 g/dL 72.0 94.7  Hematocrit 09.6 - 46.0 % 37.2 37.6  Platelets 150 - 400 K/uL 254 242      CMP     Component Value Date/Time   NA 137 09/18/2020 1449   K 3.6 09/18/2020 1449   CL 99 09/18/2020 1449   CO2 27 09/18/2020 1449   GLUCOSE 107 (H) 09/18/2020 1449   BUN 18 09/18/2020 1449   CREATININE 0.97 09/18/2020 1449   CALCIUM 9.5 09/18/2020 1449   GFRNONAA >60 09/18/2020 1449     No results found.     Assessment & Plan:   1. Cellulitis of right lower extremity We will reevaluate patient's next wrap changed to ensure it is improving. - doxycycline (VIBRAMYCIN) 100 MG capsule; Take 1 capsule (100 mg total) by mouth 2 (two) times daily.  Dispense: 20 capsule; Refill: 0  2. Lymphedema No surgery or intervention at this point in time.    I have had a long discussion with the patient regarding venous insufficiency and why it  causes symptoms, specifically venous ulceration .  I have discussed with the patient the chronic skin changes that accompany venous insufficiency and the long term sequela such as infection and recurring  ulceration.  Patient will be placed in [employer] which will be changed weekly drainage permitting.  In addition, behavioral modification including several periods of elevation of the lower extremities during the day will be continued. Achieving a position with the ankles at heart level was stressed to the patient  The patient is instructed to begin routine exercise, especially walking on a daily basis  The patient will return to the office weekly for wrap changes.  The patient has recently been fitted for compression wraps and we can transition once she has received those.   3. Hyperlipidemia, unspecified hyperlipidemia type Continue statin as ordered and reviewed, no changes at this time   4. Benign essential hypertension Continue antihypertensive medications as already ordered, these medications have been reviewed and there are no changes at this time.    Current Outpatient Medications on File Prior to Visit  Medication Sig Dispense Refill  . amLODipine (NORVASC) 5 MG tablet Take by mouth.    Marland Kitchen amoxicillin-clavulanate (AUGMENTIN) 875-125 MG tablet Take 1 tablet by mouth 2 (two) times daily. 20 tablet 0  . aspirin 81 MG chewable tablet Chew by mouth.    Marland Kitchen atorvastatin (LIPITOR) 40 MG tablet Take by mouth.    . Cholecalciferol 50 MCG (2000 UT) CAPS Take by mouth.    . folic acid (FOLVITE) 1 MG tablet Take by mouth.    . furosemide (LASIX) 20 MG tablet Take by mouth.    . levothyroxine (SYNTHROID) 100 MCG tablet Take by mouth.    . meclizine (ANTIVERT) 25 MG tablet Take by mouth.    . meloxicam (MOBIC) 7.5 MG tablet Take by mouth.    . methotrexate (RHEUMATREX) 2.5 MG tablet Take by mouth.    . metoprolol tartrate (LOPRESSOR) 25 MG tablet Take by mouth.    . ondansetron (ZOFRAN ODT) 4 MG disintegrating tablet Take 1 tablet (4 mg  total) by mouth every 8 (eight) hours as needed for nausea or vomiting. 20 tablet 0  . oxybutynin (DITROPAN) 5 MG tablet Take by mouth.    . simvastatin (ZOCOR) 20 MG tablet     . pantoprazole (PROTONIX) 40 MG tablet Take by mouth.     No current facility-administered medications on file prior to visit.    There are no  Patient Instructions on file for this visit. No follow-ups on file.   Kris Hartmann, NP

## 2020-09-30 DIAGNOSIS — E663 Overweight: Secondary | ICD-10-CM | POA: Diagnosis not present

## 2020-09-30 DIAGNOSIS — R809 Proteinuria, unspecified: Secondary | ICD-10-CM | POA: Diagnosis not present

## 2020-09-30 DIAGNOSIS — N179 Acute kidney failure, unspecified: Secondary | ICD-10-CM | POA: Diagnosis not present

## 2020-09-30 DIAGNOSIS — I10 Essential (primary) hypertension: Secondary | ICD-10-CM | POA: Diagnosis not present

## 2020-09-30 DIAGNOSIS — R8290 Unspecified abnormal findings in urine: Secondary | ICD-10-CM | POA: Diagnosis not present

## 2020-10-01 ENCOUNTER — Ambulatory Visit (INDEPENDENT_AMBULATORY_CARE_PROVIDER_SITE_OTHER): Payer: Medicare HMO | Admitting: Nurse Practitioner

## 2020-10-01 ENCOUNTER — Encounter (INDEPENDENT_AMBULATORY_CARE_PROVIDER_SITE_OTHER): Payer: Self-pay

## 2020-10-01 ENCOUNTER — Other Ambulatory Visit: Payer: Self-pay

## 2020-10-01 VITALS — BP 160/77 | HR 80 | Resp 16 | Wt 275.6 lb

## 2020-10-01 DIAGNOSIS — I890 Lymphedema, not elsewhere classified: Secondary | ICD-10-CM | POA: Diagnosis not present

## 2020-10-01 NOTE — Progress Notes (Signed)
History of Present Illness  There is no documented history at this time  Assessments & Plan   There are no diagnoses linked to this encounter.    Additional instructions  Subjective:  Patient presents with venous ulcer of the Bilateral lower extremity.    Procedure:  3 layer unna wrap was placed Bilateral lower extremity.   Plan:   Follow up in one week.  

## 2020-10-03 ENCOUNTER — Encounter (INDEPENDENT_AMBULATORY_CARE_PROVIDER_SITE_OTHER): Payer: Self-pay | Admitting: Nurse Practitioner

## 2020-10-08 ENCOUNTER — Encounter (INDEPENDENT_AMBULATORY_CARE_PROVIDER_SITE_OTHER): Payer: Self-pay

## 2020-10-08 ENCOUNTER — Ambulatory Visit (INDEPENDENT_AMBULATORY_CARE_PROVIDER_SITE_OTHER): Payer: Medicare HMO | Admitting: Nurse Practitioner

## 2020-10-08 ENCOUNTER — Other Ambulatory Visit: Payer: Self-pay

## 2020-10-08 VITALS — BP 161/84 | HR 80 | Resp 16 | Wt 270.0 lb

## 2020-10-08 DIAGNOSIS — L03115 Cellulitis of right lower limb: Secondary | ICD-10-CM | POA: Diagnosis not present

## 2020-10-08 DIAGNOSIS — I890 Lymphedema, not elsewhere classified: Secondary | ICD-10-CM

## 2020-10-08 DIAGNOSIS — R059 Cough, unspecified: Secondary | ICD-10-CM | POA: Diagnosis not present

## 2020-10-08 NOTE — Progress Notes (Signed)
History of Present Illness  There is no documented history at this time  Assessments & Plan   There are no diagnoses linked to this encounter.    Additional instructions  Subjective:  Patient presents with venous ulcer of the Bilateral lower extremity.    Procedure:  3 layer unna wrap was placed Bilateral lower extremity.   Plan:   Follow up in one week.  

## 2020-10-14 ENCOUNTER — Encounter (INDEPENDENT_AMBULATORY_CARE_PROVIDER_SITE_OTHER): Payer: Self-pay | Admitting: Nurse Practitioner

## 2020-10-14 ENCOUNTER — Ambulatory Visit (INDEPENDENT_AMBULATORY_CARE_PROVIDER_SITE_OTHER): Payer: Medicare HMO | Admitting: Nurse Practitioner

## 2020-10-14 ENCOUNTER — Other Ambulatory Visit: Payer: Self-pay

## 2020-10-14 VITALS — BP 154/72 | HR 89 | Ht 61.0 in | Wt 268.0 lb

## 2020-10-14 DIAGNOSIS — I10 Essential (primary) hypertension: Secondary | ICD-10-CM | POA: Diagnosis not present

## 2020-10-14 DIAGNOSIS — E663 Overweight: Secondary | ICD-10-CM | POA: Diagnosis not present

## 2020-10-14 DIAGNOSIS — I890 Lymphedema, not elsewhere classified: Secondary | ICD-10-CM

## 2020-10-14 DIAGNOSIS — N1831 Chronic kidney disease, stage 3a: Secondary | ICD-10-CM | POA: Diagnosis not present

## 2020-10-14 DIAGNOSIS — L03115 Cellulitis of right lower limb: Secondary | ICD-10-CM

## 2020-10-17 ENCOUNTER — Encounter (INDEPENDENT_AMBULATORY_CARE_PROVIDER_SITE_OTHER): Payer: Self-pay | Admitting: Nurse Practitioner

## 2020-10-24 ENCOUNTER — Encounter (INDEPENDENT_AMBULATORY_CARE_PROVIDER_SITE_OTHER): Payer: Self-pay | Admitting: Nurse Practitioner

## 2020-10-24 NOTE — Progress Notes (Signed)
Subjective:    Patient ID: Kristen Marquez, female    DOB: 1950-12-28, 70 y.o.   MRN: [id number] Chief Complaint  Patient presents with  . Follow-up    BIL unna boot check    The patient returns to the office for followup evaluation regarding leg swelling.  The swelling has improved quite a bit and the pain associated with swelling has decreased substantially. There have not been any interval development of a ulcerations or wounds.  Since the previous visit the patient has been wearing graduated compression stockings and has noted little significant improvement in the lymphedema. The patient has been using compression routinely morning until night.  The patient also states elevation during the day and exercise is being done too.      Review of Systems  Cardiovascular: Positive for leg swelling.  All other systems reviewed and are negative.      Objective:   Physical Exam Vitals reviewed.  HENT:     Head: Normocephalic.  Cardiovascular:     Rate and Rhythm: Normal rate.     Pulses: Normal pulses.  Pulmonary:     Effort: Pulmonary effort is normal.  Musculoskeletal:     Right lower leg: Edema present.     Left lower leg: Edema present.  Skin:    General: Skin is warm and dry.  Neurological:     Mental Status: She is alert and oriented to person, place, and time.  Psychiatric:        Mood and Affect: Mood normal.        Behavior: Behavior normal.        Thought Content: Thought content normal.        Judgment: Judgment normal.     BP (!) 154/72   Pulse 89   Ht 5\' 1"  (1.549 m)   Wt 268 lb (121.6 kg)   BMI 50.64 kg/m   Past Medical History:  Diagnosis Date  . Arthritis   . High cholesterol   . Hypertension   . Thyroid disease     Social History   Socioeconomic History  . Marital status: Married    Spouse name: Not on file  . Number of children: Not on file  . Years of education: Not on file  . Highest education level: Not on file  Occupational History   . Not on file  Tobacco Use  . Smoking status: Former Smoker    Types: Cigarettes    Start date: 07/23/1995  . Smokeless tobacco: Never Used  Substance and Sexual Activity  . Alcohol use: Yes  . Drug use: Never  . Sexual activity: Not on file  Other Topics Concern  . Not on file  Social History Narrative  . Not on file   Social Determinants of Health   Financial Resource Strain: Not on file  Food Insecurity: Not on file  Transportation Needs: Not on file  Physical Activity: Not on file  Stress: Not on file  Social Connections: Not on file  Intimate Partner Violence: Not on file    History reviewed. No pertinent surgical history.  History reviewed. No pertinent family history.  Allergies  Allergen Reactions  . Diltiazem     Other reaction(s): Headache, Other (see comments) Other reaction(s): Other (See Comments) Headache Headache Headache Headache Severe headache   . Diltiazem Hcl Other (See Comments)    Headache Headache     CBC Latest Ref Rng & Units 09/18/2020 09/11/2020  WBC 4.0 - 10.5 K/uL 6.7 7.7  Hemoglobin 12.0 - 15.0 g/dL 12.4 58.0  Hematocrit 99.8 - 46.0 % 37.2 37.6  Platelets 150 - 400 K/uL 254 242      CMP     Component Value Date/Time   NA 137 09/18/2020 1449   K 3.6 09/18/2020 1449   CL 99 09/18/2020 1449   CO2 27 09/18/2020 1449   GLUCOSE 107 (H) 09/18/2020 1449   BUN 18 09/18/2020 1449   CREATININE 0.97 09/18/2020 1449   CALCIUM 9.5 09/18/2020 1449   GFRNONAA >60 09/18/2020 1449     No results found.     Assessment & Plan:   1. Lymphedema, not elsewhere classified Recommend:  No surgery or intervention at this point in time.    I have reviewed my previous discussion with the patient regarding swelling and why it causes symptoms.  Patient will continue wearing graduated compression stockings class 1 (20-30 mmHg) on a daily basis. The patient will  beginning wearing the stockings first thing in the morning and removing them in  the evening. The patient is instructed specifically not to sleep in the stockings.    In addition, behavioral modification including several periods of elevation of the lower extremities during the day will be continued.  This was reviewed with the patient during the initial visit.  The patient will also continue routine exercise, especially walking on a daily basis as was discussed during the initial visit.    Despite conservative treatments of at least 4 weeks including graduated compression therapy class 1 and behavioral modification including exercise and elevation the patient  has not obtained adequate control of the lymphedema.  The patient still has stage 3 lymphedema and therefore, I believe that a lymph pump should be added to improve the control of the patient's lymphedema.  Additionally, a lymph pump is warranted because it will reduce the risk of cellulitis and ulceration in the future.  Patient should follow-up in 3 months    2. Cellulitis of right lower extremity This has resolved  3. Benign essential hypertension Continue antihypertensive medications as already ordered, these medications have been reviewed and there are no changes at this time.    Current Outpatient Medications on File Prior to Visit  Medication Sig Dispense Refill  . amLODipine (NORVASC) 5 MG tablet Take by mouth.    Marland Kitchen amoxicillin-clavulanate (AUGMENTIN) 875-125 MG tablet Take 1 tablet by mouth 2 (two) times daily. 20 tablet 0  . aspirin 81 MG chewable tablet Chew by mouth.    Marland Kitchen atorvastatin (LIPITOR) 40 MG tablet Take by mouth.    . budesonide-formoterol (SYMBICORT) 160-4.5 MCG/ACT inhaler Inhale into the lungs.    . Cholecalciferol 50 MCG (2000 UT) CAPS Take by mouth.    . doxycycline (VIBRAMYCIN) 100 MG capsule Take 1 capsule (100 mg total) by mouth 2 (two) times daily. 20 capsule 0  . folic acid (FOLVITE) 1 MG tablet Take by mouth.    . furosemide (LASIX) 20 MG tablet Take by mouth.    Marland Kitchen  HYDROcodone-homatropine (HYCODAN) 5-1.5 MG/5ML syrup Take by mouth.    . levothyroxine (SYNTHROID) 100 MCG tablet Take by mouth.    . meclizine (ANTIVERT) 25 MG tablet Take by mouth.    . meloxicam (MOBIC) 7.5 MG tablet Take by mouth.    . methotrexate (RHEUMATREX) 2.5 MG tablet Take by mouth.    . metoprolol tartrate (LOPRESSOR) 25 MG tablet Take by mouth.    . ondansetron (ZOFRAN ODT) 4 MG disintegrating tablet Take 1 tablet (4 mg  total) by mouth every 8 (eight) hours as needed for nausea or vomiting. 20 tablet 0  . oxybutynin (DITROPAN) 5 MG tablet Take by mouth.    . predniSONE (DELTASONE) 10 MG tablet Take 4 tabs daily for 3 days, then 3 tabs daily x 3 days, then 2 tabs daily for 3 days, then 1 tab daily x 3 days.    . simvastatin (ZOCOR) 20 MG tablet     . pantoprazole (PROTONIX) 40 MG tablet Take by mouth.     No current facility-administered medications on file prior to visit.    There are no Patient Instructions on file for this visit. No follow-ups on file.   Georgiana Spinner, NP

## 2020-11-11 ENCOUNTER — Other Ambulatory Visit: Payer: Self-pay

## 2020-11-11 ENCOUNTER — Encounter (INDEPENDENT_AMBULATORY_CARE_PROVIDER_SITE_OTHER): Payer: Self-pay | Admitting: Vascular Surgery

## 2020-11-11 ENCOUNTER — Ambulatory Visit (INDEPENDENT_AMBULATORY_CARE_PROVIDER_SITE_OTHER): Payer: Medicare HMO | Admitting: Vascular Surgery

## 2020-11-11 VITALS — BP 154/86 | HR 80 | Resp 16 | Wt 284.0 lb

## 2020-11-11 DIAGNOSIS — M5137 Other intervertebral disc degeneration, lumbosacral region: Secondary | ICD-10-CM | POA: Diagnosis not present

## 2020-11-11 DIAGNOSIS — I890 Lymphedema, not elsewhere classified: Secondary | ICD-10-CM | POA: Diagnosis not present

## 2020-11-11 DIAGNOSIS — I10 Essential (primary) hypertension: Secondary | ICD-10-CM

## 2020-11-11 DIAGNOSIS — I872 Venous insufficiency (chronic) (peripheral): Secondary | ICD-10-CM | POA: Diagnosis not present

## 2020-11-11 DIAGNOSIS — K219 Gastro-esophageal reflux disease without esophagitis: Secondary | ICD-10-CM | POA: Diagnosis not present

## 2020-11-14 ENCOUNTER — Encounter (INDEPENDENT_AMBULATORY_CARE_PROVIDER_SITE_OTHER): Payer: Self-pay | Admitting: Vascular Surgery

## 2020-11-14 NOTE — Progress Notes (Signed)
MRN : [id number]  Kristen Marquez is a 70 y.o. (1951-04-23) female who presents with chief complaint of  Chief Complaint  Patient presents with  . Follow-up    4wk follow up  .  History of Present Illness:   The patient returns to the office for followup evaluation regarding leg swelling.  The swelling has persisted and the pain associated with swelling continues. There have not been any interval development of a ulcerations or wounds.  Since the previous visit the patient has been wearing graduated compression stockings and has noted little if any improvement in the lymphedema. The patient has been using compression routinely morning until night.  The patient also states elevation during the day and exercise is being done too.   Current Meds  Medication Sig  . amLODipine (NORVASC) 5 MG tablet Take by mouth.  Marland Kitchen aspirin 81 MG chewable tablet Chew by mouth.  Marland Kitchen atorvastatin (LIPITOR) 40 MG tablet Take by mouth.  . budesonide-formoterol (SYMBICORT) 160-4.5 MCG/ACT inhaler Inhale into the lungs.  . Cholecalciferol 50 MCG (2000 UT) CAPS Take by mouth.  . folic acid (FOLVITE) 1 MG tablet Take by mouth.  . furosemide (LASIX) 20 MG tablet Take by mouth.  Marland Kitchen HYDROcodone-homatropine (HYCODAN) 5-1.5 MG/5ML syrup Take by mouth.  . levothyroxine (SYNTHROID) 100 MCG tablet Take by mouth.  . meclizine (ANTIVERT) 25 MG tablet Take by mouth.  . meloxicam (MOBIC) 7.5 MG tablet Take by mouth.  . methotrexate (RHEUMATREX) 2.5 MG tablet Take by mouth.  . metoprolol tartrate (LOPRESSOR) 25 MG tablet Take by mouth.  . ondansetron (ZOFRAN ODT) 4 MG disintegrating tablet Take 1 tablet (4 mg total) by mouth every 8 (eight) hours as needed for nausea or vomiting.  Marland Kitchen oxybutynin (DITROPAN) 5 MG tablet Take by mouth.  . simvastatin (ZOCOR) 20 MG tablet     Past Medical History:  Diagnosis Date  . Arthritis   . High cholesterol   . Hypertension   . Thyroid disease     History reviewed. No pertinent  surgical history.  Social History Social History   Tobacco Use  . Smoking status: Former Smoker    Types: Cigarettes    Start date: 07/23/1995  . Smokeless tobacco: Never Used  Substance Use Topics  . Alcohol use: Yes  . Drug use: Never    Family History History reviewed. No pertinent family history.  Allergies  Allergen Reactions  . Diltiazem     Other reaction(s): Headache, Other (see comments) Other reaction(s): Other (See Comments) Headache Headache Headache Headache Severe headache   . Diltiazem Hcl Other (See Comments)    Headache Headache      REVIEW OF SYSTEMS (Negative unless checked)  Constitutional: Weight loss  Fever  Chills Cardiac: Chest pain   Chest pressure   Palpitations   Shortness of breath when laying flat   Shortness of breath with exertion. Vascular:  Pain in legs with walking   Pain in legs at rest  History of DVT   Phlebitis   Swelling in legs   Varicose veins   Non-healing ulcers Pulmonary:   Uses home oxygen   Productive cough   Hemoptysis   Wheeze  COPD   Asthma Neurologic:  Dizziness   Seizures   History of stroke   History of TIA  Aphasia   Vissual changes   Weakness or numbness in arm   Weakness or numbness in leg Musculoskeletal:   Joint swelling   Joint pain   Low back pain  Hematologic:  [] Easy bruising  [] Easy bleeding   [] Hypercoagulable state   [] Anemic Gastrointestinal:  [] Diarrhea   [] Vomiting  [x] Gastroesophageal reflux/heartburn   [] Difficulty swallowing. Genitourinary:  [] Chronic kidney disease   [] Difficult urination  [] Frequent urination   [] Blood in urine Skin:  [] Rashes   [] Ulcers  Psychological:  [] History of anxiety   []  History of major depression.  Physical Examination  Vitals:   11/11/20 1612  BP: (!) 154/86  Pulse: 80  Resp: 16  Weight: 284 lb (128.8 kg)   Body mass index is 53.66 kg/m. Gen: WD/WN, NAD Head: [place]/AT, No temporalis wasting.   Ear/Nose/Throat: Hearing grossly intact, nares w/o erythema or drainage Eyes: PER, EOMI, sclera nonicteric.  Neck: Supple, no large masses.   Pulmonary:  Good air movement, no audible wheezing bilaterally, no use of accessory muscles.  Cardiac: RRR, no JVD Vascular: scattered varicosities present bilaterally.  Mild venous stasis changes to the legs bilaterally. 4+ firm non-pitting edema. Vessel Right Left  Radial Palpable Palpable  Gastrointestinal: Non-distended. No guarding/no peritoneal signs.  Musculoskeletal: M/S 5/5 throughout.  No deformity or atrophy.  Neurologic: CN 2-12 intact. Symmetrical.  Speech is fluent. Motor exam as listed above. Psychiatric: Judgment intact, Mood & affect appropriate for pt's clinical situation. Dermatologic: Severe rashes no ulcers noted.  No changes consistent with cellulitis. Lymph : + lichenification or skin changes of chronic lymphedema.  CBC Lab Results  Component Value Date   WBC 6.7 09/18/2020   HGB 12.0 09/18/2020   HCT 37.2 09/18/2020   MCV 99.2 09/18/2020   PLT 254 09/18/2020    BMET    Component Value Date/Time   NA 137 09/18/2020 1449   K 3.6 09/18/2020 1449   CL 99 09/18/2020 1449   CO2 27 09/18/2020 1449   GLUCOSE 107 (H) 09/18/2020 1449   BUN 18 09/18/2020 1449   CREATININE 0.97 09/18/2020 1449   CALCIUM 9.5 09/18/2020 1449   GFRNONAA >60 09/18/2020 1449   CrCl cannot be calculated (Patient's most recent lab result is older than the maximum 21 days allowed.).  COAG No results found for: INR, PROTIME  Radiology No results found.  Assessment/Plan 1. Lymphedema, not elsewhere classified Recommend:  No surgery or intervention at this point in time.    I have reviewed my previous discussion with the patient regarding swelling and why it causes symptoms.  Patient will continue wearing graduated compression stockings class 1 (20-30 mmHg) on a daily basis. The patient will  beginning wearing the stockings first thing in  the morning and removing them in the evening. The patient is instructed specifically not to sleep in the stockings.    In addition, behavioral modification including several periods of elevation of the lower extremities during the day will be continued.  This was reviewed with the patient during the initial visit.  The patient will also continue routine exercise, especially walking on a daily basis as was discussed during the initial visit.    Despite conservative treatments including graduated compression therapy class 1 and behavioral modification including exercise and elevation the patient  has not obtained adequate control of the lymphedema.  The patient still has stage 3 lymphedema and therefore, I believe that a lymph pump should be added to improve the control of the patient's lymphedema.  Additionally, a lymph pump is warranted because it will reduce the risk of cellulitis and ulceration in the future.  Patient should follow-up in six months    A total of 35 minutes was spent with  this patient and greater than 50% was spent in counseling and coordination of care with the patient.  Discussion included the treatment options for vascular disease including indications for surgery and intervention.  Also discussed is the appropriate timing of treatment.  In addition medical therapy was discussed.   2. Venous (peripheral) insufficiency Recommend:  No surgery or intervention at this point in time.    I have reviewed my previous discussion with the patient regarding swelling and why it causes symptoms.  Patient will continue wearing graduated compression stockings class 1 (20-30 mmHg) on a daily basis. The patient will  beginning wearing the stockings first thing in the morning and removing them in the evening. The patient is instructed specifically not to sleep in the stockings.    In addition, behavioral modification including several periods of elevation of the lower extremities during the  day will be continued.  This was reviewed with the patient during the initial visit.  The patient will also continue routine exercise, especially walking on a daily basis as was discussed during the initial visit.    Despite conservative treatments including graduated compression therapy class 1 and behavioral modification including exercise and elevation the patient  has not obtained adequate control of the lymphedema.  The patient still has stage 3 lymphedema and therefore, I believe that a lymph pump should be added to improve the control of the patient's lymphedema.  Additionally, a lymph pump is warranted because it will reduce the risk of cellulitis and ulceration in the future.  Patient should follow-up in six months    3. Benign essential hypertension Continue antihypertensive medications as already ordered, these medications have been reviewed and there are no changes at this time.   4. DDD (degenerative disc disease), lumbosacral Continue NSAID medications as already ordered, these medications have been reviewed and there are no changes at this time.  Continued activity and therapy was stressed.   5. Gastroesophageal reflux disease, unspecified whether esophagitis present Continue PPI as already ordered, this medication has been reviewed and there are no changes at this time.  Avoidence of caffeine and alcohol  Moderate elevation of the head of the bed     Levora Dredge, MD  11/14/2020 2:13 PM

## 2020-11-17 DIAGNOSIS — I890 Lymphedema, not elsewhere classified: Secondary | ICD-10-CM | POA: Diagnosis not present

## 2020-11-23 DIAGNOSIS — K219 Gastro-esophageal reflux disease without esophagitis: Secondary | ICD-10-CM | POA: Diagnosis not present

## 2020-11-23 DIAGNOSIS — I10 Essential (primary) hypertension: Secondary | ICD-10-CM | POA: Diagnosis not present

## 2020-11-23 DIAGNOSIS — R600 Localized edema: Secondary | ICD-10-CM | POA: Diagnosis not present

## 2020-11-23 DIAGNOSIS — E785 Hyperlipidemia, unspecified: Secondary | ICD-10-CM | POA: Diagnosis not present

## 2020-11-23 DIAGNOSIS — E039 Hypothyroidism, unspecified: Secondary | ICD-10-CM | POA: Diagnosis not present

## 2020-12-06 DIAGNOSIS — I10 Essential (primary) hypertension: Secondary | ICD-10-CM | POA: Diagnosis not present

## 2020-12-09 DIAGNOSIS — M5440 Lumbago with sciatica, unspecified side: Secondary | ICD-10-CM | POA: Diagnosis not present

## 2020-12-09 DIAGNOSIS — Z79899 Other long term (current) drug therapy: Secondary | ICD-10-CM | POA: Diagnosis not present

## 2020-12-09 DIAGNOSIS — M0579 Rheumatoid arthritis with rheumatoid factor of multiple sites without organ or systems involvement: Secondary | ICD-10-CM | POA: Diagnosis not present

## 2020-12-09 DIAGNOSIS — G8929 Other chronic pain: Secondary | ICD-10-CM | POA: Diagnosis not present

## 2020-12-09 DIAGNOSIS — M8949 Other hypertrophic osteoarthropathy, multiple sites: Secondary | ICD-10-CM | POA: Diagnosis not present

## 2020-12-27 DIAGNOSIS — E039 Hypothyroidism, unspecified: Secondary | ICD-10-CM | POA: Diagnosis not present

## 2020-12-27 DIAGNOSIS — I10 Essential (primary) hypertension: Secondary | ICD-10-CM | POA: Diagnosis not present

## 2020-12-27 DIAGNOSIS — E785 Hyperlipidemia, unspecified: Secondary | ICD-10-CM | POA: Diagnosis not present

## 2020-12-27 DIAGNOSIS — K219 Gastro-esophageal reflux disease without esophagitis: Secondary | ICD-10-CM | POA: Diagnosis not present

## 2020-12-28 DIAGNOSIS — G8929 Other chronic pain: Secondary | ICD-10-CM | POA: Diagnosis not present

## 2020-12-28 DIAGNOSIS — M5440 Lumbago with sciatica, unspecified side: Secondary | ICD-10-CM | POA: Diagnosis not present

## 2020-12-28 DIAGNOSIS — M6281 Muscle weakness (generalized): Secondary | ICD-10-CM | POA: Diagnosis not present

## 2020-12-28 DIAGNOSIS — E785 Hyperlipidemia, unspecified: Secondary | ICD-10-CM | POA: Diagnosis not present

## 2020-12-28 DIAGNOSIS — E039 Hypothyroidism, unspecified: Secondary | ICD-10-CM | POA: Diagnosis not present

## 2020-12-28 DIAGNOSIS — I10 Essential (primary) hypertension: Secondary | ICD-10-CM | POA: Diagnosis not present

## 2020-12-28 DIAGNOSIS — K219 Gastro-esophageal reflux disease without esophagitis: Secondary | ICD-10-CM | POA: Diagnosis not present

## 2020-12-30 DIAGNOSIS — H04123 Dry eye syndrome of bilateral lacrimal glands: Secondary | ICD-10-CM | POA: Diagnosis not present

## 2020-12-30 DIAGNOSIS — Z0100 Encounter for examination of eyes and vision without abnormal findings: Secondary | ICD-10-CM | POA: Diagnosis not present

## 2020-12-31 DIAGNOSIS — Z0100 Encounter for examination of eyes and vision without abnormal findings: Secondary | ICD-10-CM | POA: Diagnosis not present

## 2021-01-05 DIAGNOSIS — M5440 Lumbago with sciatica, unspecified side: Secondary | ICD-10-CM | POA: Diagnosis not present

## 2021-01-05 DIAGNOSIS — G8929 Other chronic pain: Secondary | ICD-10-CM | POA: Diagnosis not present

## 2021-01-12 DIAGNOSIS — G8929 Other chronic pain: Secondary | ICD-10-CM | POA: Diagnosis not present

## 2021-01-12 DIAGNOSIS — M5440 Lumbago with sciatica, unspecified side: Secondary | ICD-10-CM | POA: Diagnosis not present

## 2021-01-21 DIAGNOSIS — G8929 Other chronic pain: Secondary | ICD-10-CM | POA: Diagnosis not present

## 2021-01-21 DIAGNOSIS — M6281 Muscle weakness (generalized): Secondary | ICD-10-CM | POA: Diagnosis not present

## 2021-01-21 DIAGNOSIS — M5440 Lumbago with sciatica, unspecified side: Secondary | ICD-10-CM | POA: Diagnosis not present

## 2021-01-25 DIAGNOSIS — M6281 Muscle weakness (generalized): Secondary | ICD-10-CM | POA: Diagnosis not present

## 2021-01-25 DIAGNOSIS — G8929 Other chronic pain: Secondary | ICD-10-CM | POA: Diagnosis not present

## 2021-01-25 DIAGNOSIS — M5440 Lumbago with sciatica, unspecified side: Secondary | ICD-10-CM | POA: Diagnosis not present

## 2021-02-03 DIAGNOSIS — M5440 Lumbago with sciatica, unspecified side: Secondary | ICD-10-CM | POA: Diagnosis not present

## 2021-02-03 DIAGNOSIS — G8929 Other chronic pain: Secondary | ICD-10-CM | POA: Diagnosis not present

## 2021-02-08 DIAGNOSIS — R0989 Other specified symptoms and signs involving the circulatory and respiratory systems: Secondary | ICD-10-CM | POA: Diagnosis not present

## 2021-02-08 DIAGNOSIS — E039 Hypothyroidism, unspecified: Secondary | ICD-10-CM | POA: Diagnosis not present

## 2021-02-08 DIAGNOSIS — G8929 Other chronic pain: Secondary | ICD-10-CM | POA: Diagnosis not present

## 2021-02-08 DIAGNOSIS — I10 Essential (primary) hypertension: Secondary | ICD-10-CM | POA: Diagnosis not present

## 2021-02-08 DIAGNOSIS — M5440 Lumbago with sciatica, unspecified side: Secondary | ICD-10-CM | POA: Diagnosis not present

## 2021-02-08 DIAGNOSIS — E538 Deficiency of other specified B group vitamins: Secondary | ICD-10-CM | POA: Diagnosis not present

## 2021-02-09 NOTE — Progress Notes (Signed)
MRN : [id number]  Kristen Marquez is a 70 y.o. (Jun 19, 1951) female who presents with chief complaint of No chief complaint on file. Marland Kitchen  History of Present Illness:   The patient returns to the office for followup evaluation regarding leg swelling.  The swelling has persisted but with the lymph pump is much, much better controlled. The pain associated with swelling is essentially eliminated. There have not been any interval development of a ulcerations or wounds.  The patient denies problems with the pump, noting it is working well and the leggings are in good condition.  Since the previous visit the patient has been wearing graduated compression stockings and using the lymph pump on a routine basis and  has noted significant improvement in the lymphedema.   Patient stated the lymph pump has been a very positive factor in her care.    No outpatient medications have been marked as taking for the 02/10/21 encounter (Appointment) with Gilda Crease, Latina Craver, MD.    Past Medical History:  Diagnosis Date  . Arthritis   . High cholesterol   . Hypertension   . Thyroid disease     No past surgical history on file.  Social History Social History   Tobacco Use  . Smoking status: Former Smoker    Types: Cigarettes    Start date: 07/23/1995  . Smokeless tobacco: Never Used  Substance Use Topics  . Alcohol use: Yes  . Drug use: Never    Family History No family history on file.  Allergies  Allergen Reactions  . Diltiazem     Other reaction(s): Headache, Other (see comments) Other reaction(s): Other (See Comments) Headache Headache Headache Headache Severe headache   . Diltiazem Hcl Other (See Comments)    Headache Headache      REVIEW OF SYSTEMS (Negative unless checked)  Constitutional: [] Weight loss  [] Fever  [] Chills Cardiac: [] Chest pain   [] Chest pressure   [] Palpitations   [] Shortness of breath when laying flat   [] Shortness of breath with exertion. Vascular:   [] Pain in legs with walking   [x] Pain in legs at rest  [] History of DVT   [] Phlebitis   [x] Swelling in legs   [] Varicose veins   [] Non-healing ulcers Pulmonary:   [] Uses home oxygen   [] Productive cough   [] Hemoptysis   [] Wheeze  [] COPD   [] Asthma Neurologic:  [] Dizziness   [] Seizures   [] History of stroke   [] History of TIA  [] Aphasia   [] Vissual changes   [] Weakness or numbness in arm   [] Weakness or numbness in leg Musculoskeletal:   [] Joint swelling   [x] Joint pain   [] Low back pain Hematologic:  [] Easy bruising  [] Easy bleeding   [] Hypercoagulable state   [] Anemic Gastrointestinal:  [] Diarrhea   [] Vomiting  [x] Gastroesophageal reflux/heartburn   [] Difficulty swallowing. Genitourinary:  [] Chronic kidney disease   [] Difficult urination  [] Frequent urination   [] Blood in urine Skin:  [] Rashes   [] Ulcers  Psychological:  [] History of anxiety   []  History of major depression.  Physical Examination  There were no vitals filed for this visit. There is no height or weight on file to calculate BMI. Gen: WD/WN, NAD Head: [place]/AT, No temporalis wasting.  Ear/Nose/Throat: Hearing grossly intact, nares w/o erythema or drainage Eyes: PER, EOMI, sclera nonicteric.  Neck: Supple, no large masses.   Pulmonary:  Good air movement, no audible wheezing bilaterally, no use of accessory muscles.  Cardiac: RRR, no JVD Vascular: scattered varicosities present bilaterally.  Mild venous stasis changes to the  legs bilaterally.  2-3+ soft pitting edema Vessel Right Left  Radial Palpable Palpable  PT Palpable Palpable  DP Palpable Palpable  Gastrointestinal: Non-distended. No guarding/no peritoneal signs.  Musculoskeletal: M/S 5/5 throughout.  No deformity or atrophy.  Neurologic: CN 2-12 intact. Symmetrical.  Speech is fluent. Motor exam as listed above. Psychiatric: Judgment intact, Mood & affect appropriate for pt's clinical situation. Dermatologic: Venous rashes no ulcers noted.  No changes consistent with  cellulitis. Lymph : Mild lichenification or skin changes of chronic lymphedema.  CBC Lab Results  Component Value Date   WBC 6.7 09/18/2020   HGB 12.0 09/18/2020   HCT 37.2 09/18/2020   MCV 99.2 09/18/2020   PLT 254 09/18/2020    BMET    Component Value Date/Time   NA 137 09/18/2020 1449   K 3.6 09/18/2020 1449   CL 99 09/18/2020 1449   CO2 27 09/18/2020 1449   GLUCOSE 107 (H) 09/18/2020 1449   BUN 18 09/18/2020 1449   CREATININE 0.97 09/18/2020 1449   CALCIUM 9.5 09/18/2020 1449   GFRNONAA >60 09/18/2020 1449   CrCl cannot be calculated (Patient's most recent lab result is older than the maximum 21 days allowed.).  COAG No results found for: INR, PROTIME  Radiology No results found.   Assessment/Plan 1. Lymphedema, not elsewhere classified  No surgery or intervention at this point in time.    I have reviewed my discussion with the patient regarding lymphedema and why it  causes symptoms.  Patient will continue wearing graduated compression stockings class 1 (20-30 mmHg) on a daily basis a prescription was given. The patient is reminded to put the stockings on first thing in the morning and removing them in the evening. The patient is instructed specifically not to sleep in the stockings.   In addition, behavioral modification throughout the day will be continued.  This will include frequent elevation (such as in a recliner), use of over the counter pain medications as needed and exercise such as walking.  I have reviewed systemic causes for chronic edema such as liver, kidney and cardiac etiologies and there does not appear to be any significant changes in these organ systems over the past year.  The patient is under the impression that these organ systems are all stable and unchanged.    The patient will continue aggressive use of the  lymph pump.  This will continue to improve the edema control and prevent sequela such as ulcers and infections.   The patient will  follow-up with me in 6 months.   2. Venous (peripheral) insufficiency No surgery or intervention at this point in time.    I have reviewed my discussion with the patient regarding lymphedema and why it  causes symptoms.  Patient will continue wearing graduated compression stockings class 1 (20-30 mmHg) on a daily basis a prescription was given. The patient is reminded to put the stockings on first thing in the morning and removing them in the evening. The patient is instructed specifically not to sleep in the stockings.   In addition, behavioral modification throughout the day will be continued.  This will include frequent elevation (such as in a recliner), use of over the counter pain medications as needed and exercise such as walking.  I have reviewed systemic causes for chronic edema such as liver, kidney and cardiac etiologies and there does not appear to be any significant changes in these organ systems over the past year.  The patient is under the impression that  these organ systems are all stable and unchanged.    The patient will continue aggressive use of the  lymph pump.  This will continue to improve the edema control and prevent sequela such as ulcers and infections.   The patient will follow-up with me in 6 months.   3. Benign essential hypertension Continue antihypertensive medications as already ordered, these medications have been reviewed and there are no changes at this time.   4. DDD (degenerative disc disease), lumbosacral Continue NSAID medications as already ordered, these medications have been reviewed and there are no changes at this time.  Continued activity and therapy was stressed.   5. Hyperlipidemia, unspecified hyperlipidemia type Continue statin as ordered and reviewed, no changes at this time    Levora Dredge, MD  02/09/2021 4:56 PM

## 2021-02-10 ENCOUNTER — Encounter (INDEPENDENT_AMBULATORY_CARE_PROVIDER_SITE_OTHER): Payer: Self-pay | Admitting: Vascular Surgery

## 2021-02-10 ENCOUNTER — Ambulatory Visit (INDEPENDENT_AMBULATORY_CARE_PROVIDER_SITE_OTHER): Payer: Medicare HMO | Admitting: Vascular Surgery

## 2021-02-10 ENCOUNTER — Other Ambulatory Visit: Payer: Self-pay

## 2021-02-10 VITALS — BP 145/82 | HR 80 | Resp 16 | Wt 264.4 lb

## 2021-02-10 DIAGNOSIS — I10 Essential (primary) hypertension: Secondary | ICD-10-CM

## 2021-02-10 DIAGNOSIS — E785 Hyperlipidemia, unspecified: Secondary | ICD-10-CM

## 2021-02-10 DIAGNOSIS — I890 Lymphedema, not elsewhere classified: Secondary | ICD-10-CM | POA: Diagnosis not present

## 2021-02-10 DIAGNOSIS — M5137 Other intervertebral disc degeneration, lumbosacral region: Secondary | ICD-10-CM

## 2021-02-10 DIAGNOSIS — I872 Venous insufficiency (chronic) (peripheral): Secondary | ICD-10-CM

## 2021-02-12 ENCOUNTER — Encounter (INDEPENDENT_AMBULATORY_CARE_PROVIDER_SITE_OTHER): Payer: Self-pay | Admitting: Vascular Surgery

## 2021-02-18 ENCOUNTER — Other Ambulatory Visit: Payer: Self-pay | Admitting: Orthopedic Surgery

## 2021-02-18 DIAGNOSIS — M1612 Unilateral primary osteoarthritis, left hip: Secondary | ICD-10-CM | POA: Diagnosis not present

## 2021-02-18 DIAGNOSIS — M25552 Pain in left hip: Secondary | ICD-10-CM

## 2021-02-19 ENCOUNTER — Other Ambulatory Visit: Payer: Self-pay

## 2021-02-19 ENCOUNTER — Ambulatory Visit
Admission: RE | Admit: 2021-02-19 | Discharge: 2021-02-19 | Disposition: A | Discharge status: Home / Self Care | Payer: Medicare HMO | Source: Ambulatory Visit | Attending: Orthopedic Surgery | Admitting: Orthopedic Surgery

## 2021-02-19 DIAGNOSIS — M7062 Trochanteric bursitis, left hip: Secondary | ICD-10-CM | POA: Diagnosis not present

## 2021-02-19 DIAGNOSIS — M1612 Unilateral primary osteoarthritis, left hip: Secondary | ICD-10-CM | POA: Insufficient documentation

## 2021-02-19 DIAGNOSIS — S76312A Strain of muscle, fascia and tendon of the posterior muscle group at thigh level, left thigh, initial encounter: Secondary | ICD-10-CM | POA: Diagnosis not present

## 2021-02-19 DIAGNOSIS — M25552 Pain in left hip: Secondary | ICD-10-CM | POA: Diagnosis not present

## 2021-02-22 DIAGNOSIS — R059 Cough, unspecified: Secondary | ICD-10-CM | POA: Diagnosis not present

## 2021-02-23 ENCOUNTER — Other Ambulatory Visit: Payer: Self-pay | Admitting: Family Medicine

## 2021-02-23 DIAGNOSIS — Z1231 Encounter for screening mammogram for malignant neoplasm of breast: Secondary | ICD-10-CM

## 2021-02-24 ENCOUNTER — Inpatient Hospital Stay
Admission: RE | Admit: 2021-02-24 | Discharge: 2021-02-24 | Disposition: A | Discharge status: Home / Self Care | Payer: Self-pay | Source: Ambulatory Visit | Attending: *Deleted | Admitting: *Deleted

## 2021-02-24 ENCOUNTER — Other Ambulatory Visit: Payer: Self-pay | Admitting: *Deleted

## 2021-02-24 DIAGNOSIS — Z1231 Encounter for screening mammogram for malignant neoplasm of breast: Secondary | ICD-10-CM

## 2021-02-28 DIAGNOSIS — Z20822 Contact with and (suspected) exposure to covid-19: Secondary | ICD-10-CM | POA: Diagnosis not present

## 2021-03-01 DIAGNOSIS — Z79899 Other long term (current) drug therapy: Secondary | ICD-10-CM | POA: Diagnosis not present

## 2021-03-03 DIAGNOSIS — J22 Unspecified acute lower respiratory infection: Secondary | ICD-10-CM | POA: Diagnosis not present

## 2021-03-07 DIAGNOSIS — J22 Unspecified acute lower respiratory infection: Secondary | ICD-10-CM | POA: Diagnosis not present

## 2021-03-10 DIAGNOSIS — M7062 Trochanteric bursitis, left hip: Secondary | ICD-10-CM | POA: Diagnosis not present

## 2021-03-10 DIAGNOSIS — M0579 Rheumatoid arthritis with rheumatoid factor of multiple sites without organ or systems involvement: Secondary | ICD-10-CM | POA: Diagnosis not present

## 2021-03-10 DIAGNOSIS — Z79899 Other long term (current) drug therapy: Secondary | ICD-10-CM | POA: Diagnosis not present

## 2021-03-10 DIAGNOSIS — M159 Polyosteoarthritis, unspecified: Secondary | ICD-10-CM | POA: Diagnosis not present

## 2021-03-15 DIAGNOSIS — E538 Deficiency of other specified B group vitamins: Secondary | ICD-10-CM | POA: Diagnosis not present

## 2021-03-15 DIAGNOSIS — D649 Anemia, unspecified: Secondary | ICD-10-CM | POA: Diagnosis not present

## 2021-03-23 DIAGNOSIS — E875 Hyperkalemia: Secondary | ICD-10-CM | POA: Diagnosis not present

## 2021-03-23 DIAGNOSIS — R7989 Other specified abnormal findings of blood chemistry: Secondary | ICD-10-CM | POA: Diagnosis not present

## 2021-03-25 DIAGNOSIS — Z1211 Encounter for screening for malignant neoplasm of colon: Secondary | ICD-10-CM | POA: Diagnosis not present

## 2021-03-29 DIAGNOSIS — M47814 Spondylosis without myelopathy or radiculopathy, thoracic region: Secondary | ICD-10-CM | POA: Diagnosis not present

## 2021-03-29 DIAGNOSIS — I700 Atherosclerosis of aorta: Secondary | ICD-10-CM | POA: Diagnosis not present

## 2021-03-29 DIAGNOSIS — Z01818 Encounter for other preprocedural examination: Secondary | ICD-10-CM | POA: Diagnosis not present

## 2021-03-29 DIAGNOSIS — R0989 Other specified symptoms and signs involving the circulatory and respiratory systems: Secondary | ICD-10-CM | POA: Diagnosis not present

## 2021-03-29 DIAGNOSIS — Z03818 Encounter for observation for suspected exposure to other biological agents ruled out: Secondary | ICD-10-CM | POA: Diagnosis not present

## 2021-03-29 DIAGNOSIS — R059 Cough, unspecified: Secondary | ICD-10-CM | POA: Diagnosis not present

## 2021-03-31 DIAGNOSIS — I890 Lymphedema, not elsewhere classified: Secondary | ICD-10-CM | POA: Diagnosis not present

## 2021-03-31 DIAGNOSIS — I129 Hypertensive chronic kidney disease with stage 1 through stage 4 chronic kidney disease, or unspecified chronic kidney disease: Secondary | ICD-10-CM | POA: Diagnosis not present

## 2021-03-31 DIAGNOSIS — N1832 Chronic kidney disease, stage 3b: Secondary | ICD-10-CM | POA: Diagnosis not present

## 2021-04-04 ENCOUNTER — Ambulatory Visit
Admission: RE | Admit: 2021-04-04 | Discharge: 2021-04-04 | Disposition: A | Discharge status: Home / Self Care | Payer: Medicare HMO | Source: Ambulatory Visit | Attending: Family Medicine | Admitting: Family Medicine

## 2021-04-04 ENCOUNTER — Other Ambulatory Visit: Payer: Self-pay

## 2021-04-04 DIAGNOSIS — Z1231 Encounter for screening mammogram for malignant neoplasm of breast: Secondary | ICD-10-CM | POA: Insufficient documentation

## 2021-04-06 DIAGNOSIS — M1612 Unilateral primary osteoarthritis, left hip: Secondary | ICD-10-CM | POA: Diagnosis not present

## 2021-04-11 DIAGNOSIS — E875 Hyperkalemia: Secondary | ICD-10-CM | POA: Diagnosis not present

## 2021-04-11 DIAGNOSIS — R7989 Other specified abnormal findings of blood chemistry: Secondary | ICD-10-CM | POA: Diagnosis not present

## 2021-04-12 DIAGNOSIS — M1612 Unilateral primary osteoarthritis, left hip: Secondary | ICD-10-CM | POA: Diagnosis not present

## 2021-04-21 DIAGNOSIS — N1831 Chronic kidney disease, stage 3a: Secondary | ICD-10-CM | POA: Diagnosis not present

## 2021-04-21 DIAGNOSIS — R809 Proteinuria, unspecified: Secondary | ICD-10-CM | POA: Diagnosis not present

## 2021-04-21 DIAGNOSIS — N179 Acute kidney failure, unspecified: Secondary | ICD-10-CM | POA: Diagnosis not present

## 2021-04-21 DIAGNOSIS — I10 Essential (primary) hypertension: Secondary | ICD-10-CM | POA: Diagnosis not present

## 2021-04-21 DIAGNOSIS — E785 Hyperlipidemia, unspecified: Secondary | ICD-10-CM | POA: Diagnosis not present

## 2021-04-21 DIAGNOSIS — R8290 Unspecified abnormal findings in urine: Secondary | ICD-10-CM | POA: Diagnosis not present

## 2021-04-21 DIAGNOSIS — E663 Overweight: Secondary | ICD-10-CM | POA: Diagnosis not present

## 2021-04-27 DIAGNOSIS — Z0000 Encounter for general adult medical examination without abnormal findings: Secondary | ICD-10-CM | POA: Diagnosis not present

## 2021-04-27 DIAGNOSIS — I10 Essential (primary) hypertension: Secondary | ICD-10-CM | POA: Diagnosis not present

## 2021-04-27 DIAGNOSIS — E785 Hyperlipidemia, unspecified: Secondary | ICD-10-CM | POA: Diagnosis not present

## 2021-04-27 DIAGNOSIS — E039 Hypothyroidism, unspecified: Secondary | ICD-10-CM | POA: Diagnosis not present

## 2021-04-27 DIAGNOSIS — Z6841 Body Mass Index (BMI) 40.0 and over, adult: Secondary | ICD-10-CM | POA: Diagnosis not present

## 2021-04-27 DIAGNOSIS — E669 Obesity, unspecified: Secondary | ICD-10-CM | POA: Diagnosis not present

## 2021-04-27 DIAGNOSIS — K219 Gastro-esophageal reflux disease without esophagitis: Secondary | ICD-10-CM | POA: Diagnosis not present

## 2021-04-28 DIAGNOSIS — E7800 Pure hypercholesterolemia, unspecified: Secondary | ICD-10-CM | POA: Diagnosis not present

## 2021-04-28 DIAGNOSIS — R809 Proteinuria, unspecified: Secondary | ICD-10-CM | POA: Insufficient documentation

## 2021-04-28 DIAGNOSIS — N1831 Chronic kidney disease, stage 3a: Secondary | ICD-10-CM | POA: Insufficient documentation

## 2021-04-28 DIAGNOSIS — D631 Anemia in chronic kidney disease: Secondary | ICD-10-CM | POA: Diagnosis not present

## 2021-04-28 DIAGNOSIS — I10 Essential (primary) hypertension: Secondary | ICD-10-CM | POA: Diagnosis not present

## 2021-05-17 ENCOUNTER — Other Ambulatory Visit: Payer: Self-pay

## 2021-05-17 ENCOUNTER — Encounter: Payer: Self-pay | Admitting: Physical Therapy

## 2021-05-17 ENCOUNTER — Ambulatory Visit: Payer: Medicare HMO | Attending: Orthopedic Surgery | Admitting: Physical Therapy

## 2021-05-17 VITALS — BP 148/49 | HR 76

## 2021-05-17 DIAGNOSIS — R296 Repeated falls: Secondary | ICD-10-CM | POA: Insufficient documentation

## 2021-05-17 DIAGNOSIS — R262 Difficulty in walking, not elsewhere classified: Secondary | ICD-10-CM | POA: Diagnosis not present

## 2021-05-17 NOTE — Addendum Note (Signed)
Addended by: Johnn Hai on: 05/17/2021 06:18 PM   Modules accepted: Orders

## 2021-05-17 NOTE — Therapy (Signed)
[facility] [address] [place], [address] Phone: [phone]   Fax:  [phone]  Physical Therapy Evaluation  Patient Details  Name: Kristen Marquez MRN: [id number] Date of Birth: 1951-07-11 Referring Provider (PT): Kennedy Bucker MD   Encounter Date: 05/17/2021   PT End of Session - 05/17/21 1758     Visit Number 1    Number of Visits 11    Date for PT Re-Evaluation 06/21/21    PT Start Time 1500    PT Stop Time 1545    PT Time Calculation (min) 45 min    Activity Tolerance Patient tolerated treatment well    Behavior During Therapy [facility] for tasks assessed/performed             Past Medical History:  Diagnosis Date   Arthritis    High cholesterol    Hypertension    Thyroid disease     History reviewed. No pertinent surgical history.  Vitals:   05/17/21 1523  BP: (!) 148/49  Pulse: 76  SpO2: (!) 68%      Subjective Assessment - 05/17/21 1510     Subjective Pt reports feeling very unsteady and that she has had several falls within the past 6 months. She has to use furniture to steady herself.  She has been prescribed a rollator by her hip doctor, but she fears that she will be used to it. Reports falling while trying to lean on grill and slipping and hitting her head.    Patient is accompained by: Family member    Pertinent History Bronstein  5. Arthritis. Is followed by rheumatology. Is on Methotrexate weekly and Mobic daily. MRI a few months ago showed advanced arthritis in the left hip, with maceration of the labrum. She also had muscle tears in the gluteal and groin. She denies having pain in the area except after starting PT recently. She saw Orthopedics, but they are hesitant to consider hip replacement surgery due to her significant leg edema.    Limitations Other (comment);Walking   Difficult with balance   How long can you sit comfortably? N/a; feels uncomfortable getting up    How long  can you stand comfortably? N/a she stands more than she sits.    How long can you walk comfortably? She reports not having difficulty with her endurance    Diagnostic tests MRI    Patient Stated Goals Wants to feel less unsteady to walk better    Currently in Pain? No/denies                [facility] PT Assessment - 05/17/21 0001       Assessment   Referring Provider (PT) Kennedy Bucker MD    Prior Therapy Yes      Home Environment   Living Environment Private residence    Living Arrangements Spouse/significant other    Type of Home House    Home Access Level entry    Home Layout One level      Prior Function   Level of Independence Independent      Cognition   Overall Cognitive Status Within Functional Limits for tasks assessed               EVAL   DGI 14/24  -2 on all items except for step over obstacles and steps which were 1      10/22 sec= 0.45 m/sec with use of SPC  10/13 sec=0.77 m/sec with use of  rollator  <1 m/sec Need Intervention to reduce falls risk   - <0.6 m/sec associated with severe impairment; likely that pt is capable only of limited community or household ambulation; increased likelihood of dependence with ADLs.  - 0.4 - 0.79 m/sec - limited community ambulator - associated with need for intervention to reduce falls; increased risk for LE limitation and death and hospitalization in 1 year; increased dependence for personal care; 2-year mortality and morbidity; increased functional impairments, severe walking disability  Instructed pt to utilize rollator for increased safety and gait speed and told her to use two wheeled walker for now and that PT would reach out to PCP for order.   Educated on using four wheeled walker and that PCP would be contacted to order rollator.      PT Education - 05/17/21 1757     Education Details form and technique with exercise and explanation of balance and medical conditions that effect balance.    Person(s)  Educated Patient    Methods Explanation;Verbal cues;Handout;Demonstration    Comprehension Verbalized understanding;Returned demonstration;Verbal cues required              PT Short Term Goals - 05/17/21 1609       PT SHORT TERM GOAL #1   Title Patient will demonstrate understanding of home exercise plan.    Time 5    Period Weeks    Status New    Target Date 06/21/21               PT Long Term Goals - 05/17/21 1615       PT LONG TERM GOAL #1   Title Patient will have improved function and activity level as evidenced by an increase in FOTO score by 10 points or more.    Baseline 9/6:    Time 5    Period Weeks    Status New    Target Date 06/21/21      PT LONG TERM GOAL #2   Title Patient will demonstrate reduced falls risk as evidenced by Dynamic Gait Index (DGI) >19/24.    Baseline 05/17/21: 14/24    Time 5    Period Weeks    Status New    Target Date 06/21/21      PT LONG TERM GOAL #3   Title Pt will increase by at least 0.13 m/s in order to demonstrate clinically significant improvement in community ambulation.    Baseline 05/17/21:                    Plan - 05/17/21 1759     Clinical Impression Statement Pt is a 70 yo white female that presents for initial eval for repeated falls. She presents at increased risk for falling with a DGI score 14    Personal Factors and Comorbidities Age;Comorbidity 2    Comorbidities Arthiritis, left hip pain, HLD, HTN, obesity    Examination-Activity Limitations Locomotion Level    Examination-Participation Restrictions Cleaning;Shop;Community Activity;Other   Naviagting home space   Stability/Clinical Decision Making Stable/Uncomplicated    Clinical Decision Making Moderate    Rehab Potential Good    PT Frequency 2x / week    PT Duration Other (comment)   5   PT Treatment/Interventions DME Instruction;Gait training;Stair training;Electrical Stimulation;Neuromuscular re-education;Balance  training;Therapeutic exercise;Therapeutic activities;Functional mobility training;Patient/family education;Passive range of motion;Dry needling;Energy conservation;Joint Manipulations;Spinal Manipulations;Vestibular;Manual techniques;Aquatic Therapy;ADLs/Self Care Home Management    PT Next Visit Plan Strength testing, coordination, 5 x STS, 30 sec chair stands,  4 stage balance, MCTSIB    PT Home Exercise Plan None listed yet    Recommended Other Services Follow-up with PCP about ordering rollator    Consulted and Agree with Plan of Care Patient;Family member/caregiver    Family Member Consulted Husband             Patient will benefit from skilled therapeutic intervention in order to improve the following deficits and impairments:  Decreased balance, Decreased strength, Decreased range of motion, Decreased endurance, Abnormal gait, Difficulty walking, Impaired flexibility, Impaired sensation, Obesity, Decreased mobility, Increased edema  Visit Diagnosis: Repeated falls  Difficulty in walking, not elsewhere classified     Problem List Patient Active Problem List   Diagnosis Date Noted   Gastroesophageal reflux disease 03/17/2020   Venous (peripheral) insufficiency 03/17/2020   Chronic bilateral low back pain without sciatica 01/07/2020   Benign essential hypertension 12/31/2019   BMI 45.0-49.9, adult (HCC) 12/31/2019   DDD (degenerative disc disease), lumbosacral 12/31/2019   Dependent edema 12/31/2019   Heterozygous protein S deficiency (HCC) 12/31/2019   Hyperlipidemia 12/31/2019   Hypertonicity of bladder 12/31/2019   Hypothyroidism 12/31/2019   Lymphedema, not elsewhere classified 12/31/2019   Osteoporosis 12/31/2019   Other long term (current) drug therapy 12/31/2019   Vitamin D deficiency 12/31/2019   Weight gain 12/31/2019   Neuropathic pain 04/16/2019   Varicose veins of leg with edema, bilateral 03/07/2019   Long-term use of high-risk medication 10/03/2016    Primary osteoarthritis involving multiple joints 10/03/2016   Amaurosis fugax of right eye 06/29/2016   Osteopenia of left thigh 03/30/2016   Edema 03/01/2015   Polyarthralgia 03/01/2015   Rheumatoid arthritis involving multiple sites with positive rheumatoid factor (HCC) 03/01/2015   Rheumatoid arthritis, unspecified (HCC) 03/01/2015   Ellin Goodie PT, DPT  05/17/2021, 6:12 PM  [facility] [address] [place], [address] Phone: [phone]   Fax:  [phone]  Name: Kristen Marquez MRN: [id number] Date of Birth: [birth year]

## 2021-05-23 ENCOUNTER — Ambulatory Visit: Payer: Medicare HMO | Admitting: Physical Therapy

## 2021-05-23 DIAGNOSIS — R296 Repeated falls: Secondary | ICD-10-CM | POA: Diagnosis not present

## 2021-05-23 DIAGNOSIS — R262 Difficulty in walking, not elsewhere classified: Secondary | ICD-10-CM | POA: Diagnosis not present

## 2021-05-23 NOTE — Therapy (Signed)
[facility] [address] [place], [place], [address] Phone: [phone]   Fax:  [phone]  Physical Therapy Treatment  Patient Details  Name: Kristen Marquez MRN: [id number] Date of Birth: 1950-11-29 Referring Provider (PT): Kennedy Bucker MD   Encounter Date: 05/23/2021   PT End of Session - 05/23/21 1511     Visit Number 2    Number of Visits 11    Date for PT Re-Evaluation 06/21/21    PT Start Time 1500    PT Stop Time 1545    PT Time Calculation (min) 45 min    Activity Tolerance Patient tolerated treatment well    Behavior During Therapy [facility] for tasks assessed/performed             Past Medical History:  Diagnosis Date   Arthritis    High cholesterol    Hypertension    Thyroid disease     No past surgical history on file.  There were no vitals filed for this visit.   Subjective Assessment - 05/23/21 1508     Subjective Pt reports that she went to beach over the weekend and she was able to walk down peir with two wheeled walker.    Patient is accompained by: Family member    Pertinent History Bronstein  5. Arthritis. Is followed by rheumatology. Is on Methotrexate weekly and Mobic daily. MRI a few months ago showed advanced arthritis in the left hip, with maceration of the labrum. She also had muscle tears in the gluteal and groin. She denies having pain in the area except after starting PT recently. She saw Orthopedics, but they are hesitant to consider hip replacement surgery due to her significant leg edema.    Limitations Other (comment);Walking   Difficult with balance   How long can you sit comfortably? N/a; feels uncomfortable getting up    How long can you stand comfortably? N/a she stands more than she sits.    How long can you walk comfortably? She reports not having difficulty with her endurance    Diagnostic tests MRI    Patient Stated Goals Wants to feel less unsteady to walk better     Currently in Pain? No/denies             PHYSICAL PERFORMANCE   5 Times Sit to Stand: 15 sec  (Individuals with times that exceed the listed time have worse than average performance - predictive of recurrent falls in healthy community-living subjects)         - 71-79 y.o. 12.6 sec        30 sec chair stands: 9 REPS  <10 Woman 71-74  *A below average score indicates a risk for falls  STEADI 4-Stage Balance Test: (inability to maintain tandem stance for 10 sec = increased risk for falls) - feet together, eyes open, noncompliant surface: 10/10 sec - semi-tandem stance, eyes open, noncompliant surface: 10/10 sec - tandem stances, eyes open, noncompliant surface: 4/10 sec R foot forward, 4 /10 L foot forward   Modifed Clinical Test of Sensory Interaction for Balance    (MCTSIB):  CONDITION TIME STRATEGY SWAY  Eyes open, firm surface 30 seconds ankle   Eyes closed, firm surface 30 seconds ankle   Eyes open, foam surface 30 seconds ankle   Eyes closed, foam surface 10 seconds ankle     LE Strength:  Hip Flexion      R/L  3/5 3/5  Hip Abduction  R/L  5/5 5/5 Hip Adduction  R/L  5/5 5/5  Knee Extension R/L 4/5 4/5  Knee Flexion R/L     5/5 5/5  Dorsiflexion  R/L      5/5  5/5   NMR:  Semi-tandem Horizontal Head Turns with eyes open 2 x 10    THEREX:   Sit to Stand 3 x 10  -min VC to decrease speed of eccentric phase of exercise.           PT Education - 05/23/21 1509     Education Details form and technique with exercise    Person(s) Educated Patient    Methods Explanation;Demonstration;Verbal cues;Handout    Comprehension Verbalized understanding;Returned demonstration;Verbal cues required              PT Short Term Goals - 05/17/21 1609       PT SHORT TERM GOAL #1   Title Patient will demonstrate understanding of home exercise plan.    Time 5    Period Weeks    Status New    Target Date 06/21/21               PT Long Term Goals -  05/17/21 1615       PT LONG TERM GOAL #1   Title Patient will have improved function and activity level as evidenced by an increase in FOTO score by 10 points or more.    Baseline 9/6: 37/50    Time 5    Period Weeks    Status New    Target Date 06/21/21      PT LONG TERM GOAL #2   Title Patient will demonstrate reduced falls risk as evidenced by Dynamic Gait Index (DGI) >19/24.    Baseline 05/17/21: 14/24    Time 5    Period Weeks    Status New    Target Date 06/21/21      PT LONG TERM GOAL #3   Title Pt will increase by at least 0.13 m/s in order to demonstrate clinically significant improvement in community ambulation.    Baseline 05/17/21: 0.77 m/sec with use of rollator    Time 5    Period Weeks    Status New    Target Date 06/21/21             HEP includes:   Access Code: [email or web address] URL: [email or web address] Date: 05/23/2021 Prepared by: Ellin Goodie  Exercises Sit to Stand - 1 x daily - 7 x weekly - 3 sets - 10 reps Semi-tandem EO in corner with horizontal head turns 2 x 10      Plan - 05/23/21 1642     Clinical Impression Statement Pt presents for f/u for imbalance. She exhibits decreased static balance and LE strength and endurance that place her at an increased risk for falls. She also shows decreased hip strength and difficulty with balance when vision and somatosensory information are inhibited and she must use vestibular system for balance. She will continue to benefit from skilled PT to increase her LE strength and endurance, vestibular function, and static and dynamic balance to decrease her risk of falling and to decrease caregiver burden.    Personal Factors and Comorbidities Age;Comorbidity 2    Comorbidities Arthiritis, left hip pain, HLD, HTN, obesity    Examination-Activity Limitations Locomotion Level    Examination-Participation Restrictions Cleaning;Shop;Community Activity;Other   Naviagting home space   Stability/Clinical  Decision Making Stable/Uncomplicated    Rehab Potential Good    PT Frequency 2x /  week    PT Duration Other (comment)   5   PT Treatment/Interventions DME Instruction;Gait training;Stair training;Electrical Stimulation;Neuromuscular re-education;Balance training;Therapeutic exercise;Therapeutic activities;Functional mobility training;Patient/family education;Passive range of motion;Dry needling;Energy conservation;Joint Manipulations;Spinal Manipulations;Vestibular;Manual techniques;Aquatic Therapy;ADLs/Self Care Home Management    PT Next Visit Plan Strength testing, coordination, 5 x STS, 30 sec chair stands, 4 stage balance, MCTSIB    PT Home Exercise Plan [email or web address]    Consulted and Agree with Plan of Care Patient;Family member/caregiver    Family Member Consulted Husband             Patient will benefit from skilled therapeutic intervention in order to improve the following deficits and impairments:  Decreased balance, Decreased strength, Decreased range of motion, Decreased endurance, Abnormal gait, Difficulty walking, Impaired flexibility, Impaired sensation, Obesity, Decreased mobility, Increased edema  Visit Diagnosis: Difficulty in walking, not elsewhere classified  Repeated falls     Problem List Patient Active Problem List   Diagnosis Date Noted   Gastroesophageal reflux disease 03/17/2020   Venous (peripheral) insufficiency 03/17/2020   Chronic bilateral low back pain without sciatica 01/07/2020   Benign essential hypertension 12/31/2019   BMI 45.0-49.9, adult (HCC) 12/31/2019   DDD (degenerative disc disease), lumbosacral 12/31/2019   Dependent edema 12/31/2019   Heterozygous protein S deficiency (HCC) 12/31/2019   Hyperlipidemia 12/31/2019   Hypertonicity of bladder 12/31/2019   Hypothyroidism 12/31/2019   Lymphedema, not elsewhere classified 12/31/2019   Osteoporosis 12/31/2019   Other long term (current) drug therapy 12/31/2019   Vitamin D deficiency  12/31/2019   Weight gain 12/31/2019   Neuropathic pain 04/16/2019   Varicose veins of leg with edema, bilateral 03/07/2019   Long-term use of high-risk medication 10/03/2016   Primary osteoarthritis involving multiple joints 10/03/2016   Amaurosis fugax of right eye 06/29/2016   Osteopenia of left thigh 03/30/2016   Edema 03/01/2015   Polyarthralgia 03/01/2015   Rheumatoid arthritis involving multiple sites with positive rheumatoid factor (HCC) 03/01/2015   Rheumatoid arthritis, unspecified (HCC) 03/01/2015   Ellin Goodie PT, DPT  05/23/2021, 4:58 PM  [facility] [address] [place], [address] Phone: [phone]   Fax:  [phone]  Name: Adelyne Bensch MRN: [id number] Date of Birth: 15-Mar-1951

## 2021-05-25 ENCOUNTER — Ambulatory Visit: Payer: Medicare HMO | Admitting: Physical Therapy

## 2021-05-25 DIAGNOSIS — R262 Difficulty in walking, not elsewhere classified: Secondary | ICD-10-CM | POA: Diagnosis not present

## 2021-05-25 DIAGNOSIS — R296 Repeated falls: Secondary | ICD-10-CM | POA: Diagnosis not present

## 2021-05-25 NOTE — Therapy (Signed)
[facility] [address], [place], [address] Phone: [phone]   Fax:  [phone]  Physical Therapy Treatment  Patient Details  Name: Kristen Marquez MRN: [id number] Date of Birth: 08-14-1951 Referring Provider (PT): Kennedy Bucker MD   Encounter Date: 05/25/2021   PT End of Session - 05/25/21 1508     Visit Number 3    Number of Visits 11    Date for PT Re-Evaluation 06/21/21    PT Start Time 1500    PT Stop Time 1545    PT Time Calculation (min) 45 min    Activity Tolerance Patient tolerated treatment well    Behavior During Therapy [facility] for tasks assessed/performed             Past Medical History:  Diagnosis Date   Arthritis    High cholesterol    Hypertension    Thyroid disease     No past surgical history on file.  There were no vitals filed for this visit.   Subjective Assessment - 05/25/21 1505     Subjective Pt reports that exercises have been going well and that she has not had any falls. She forgot to call PCP about rollator and she will do that after session today.    Patient is accompained by: Family member    Pertinent History Bronstein  5. Arthritis. Is followed by rheumatology. Is on Methotrexate weekly and Mobic daily. MRI a few months ago showed advanced arthritis in the left hip, with maceration of the labrum. She also had muscle tears in the gluteal and groin. She denies having pain in the area except after starting PT recently. She saw Orthopedics, but they are hesitant to consider hip replacement surgery due to her significant leg edema.    Limitations Other (comment);Walking   Difficult with balance   How long can you sit comfortably? N/a; feels uncomfortable getting up    How long can you stand comfortably? N/a she stands more than she sits.    How long can you walk comfortably? She reports not having difficulty with her endurance    Diagnostic tests MRI    Patient Stated  Goals Wants to feel less unsteady to walk better            NMR:  Semi-tandem Eyes Open 2 x 30 sec   Sem-tandem Eyes Open Horizontal Head Turns  2 x 10  -Moderate Sway and increase use of ankle strategy   Sem-tandem Eyes Open Vertical Head Turns   -Moderate Sway and increase use of ankle strategy   Semi-tandem Eyes Closed Horizontal Head Turns 2 x 10  - Mod to Severe Sway  -Hip > Ankle Strategies   Semi-tandem Eyes Closed Vertical Head Turns 2 x 10  - Mod to Severe Sway  -Hip > Ankle Strategies   10 m Horizontal Head Turns with use of 2WW x 4  -Mild path deviation   10 m Horizontal Head Turns with Dual Task Number Recall with use of 2WW x 2  -Mild path deviation   10 m Vertical Head Turns x 4 with use of 2WW  - No path deviation   10 m Vertical Head Turns with Dual Task Number Recall x 2 with use of 2WW  -No path deviation     THEREX:  Straight Leg Raise 1 x 10  Supine Hip Abduction with YTB 1 x 10 Supine Hip Abduction with Blue TB 1 x 10 Supine Hip  Abduction with Black TB 1 x 10    Updated HEP and educated patient on changes to exercises and addition of new exercises that include SLR, Supine Hip Abduction, and Semi-tandem Head Turns with Eyes Closed.            PT Education - 05/25/21 1507     Education Details form and technique with exercise    Person(s) Educated Patient;Spouse    Methods Explanation;Demonstration;Tactile cues;Verbal cues;Handout    Comprehension Verbalized understanding;Returned demonstration;Verbal cues required              PT Short Term Goals - 05/25/21 1510       PT SHORT TERM GOAL #1   Title Patient will demonstrate understanding of home exercise plan.    Time 5    Period Weeks    Status New    Target Date 06/21/21               PT Long Term Goals - 05/25/21 1511       PT LONG TERM GOAL #1   Title Patient will have improved function and activity level as evidenced by an increase in FOTO score by 10  points or more.    Baseline 9/6: 37/50    Time 5    Period Weeks    Status New      PT LONG TERM GOAL #2   Title Patient will demonstrate reduced falls risk as evidenced by Dynamic Gait Index (DGI) >19/24.    Baseline 05/17/21: 14/24    Time 5    Period Weeks    Status New      PT LONG TERM GOAL #3   Title Pt will increase by at least 0.13 m/s in order to demonstrate clinically significant improvement in community ambulation.    Baseline 05/17/21: 0.77 m/sec with use of rollator    Time 5    Period Weeks    Status New                   Plan - 05/25/21 1510     Clinical Impression Statement Pt presents for f/u for imbalance and LE weakness. She demonstrates and improvement in static and dynamic balance with ability to complete head turns in semi-tandem stance and head turns with walking. Pt shows limitation with him flexion due to the weight of her limbs from lymphedema especially LLE which she raises with difficulty and with external rotation compensation.  She will continue to benefit from skilled PT to increase her LE strength and endurance, vestibular function, and static and dynamic balance to decrease her risk of falling and to decrease caregiver burden.    Personal Factors and Comorbidities Age;Comorbidity 2    Comorbidities Arthiritis, left hip pain, HLD, HTN, obesity    Examination-Activity Limitations Locomotion Level    Examination-Participation Restrictions Cleaning;Shop;Community Activity;Other   Naviagting home space   Stability/Clinical Decision Making Stable/Uncomplicated    Rehab Potential Good    PT Frequency 2x / week    PT Duration Other (comment)   5   PT Treatment/Interventions DME Instruction;Gait training;Stair training;Electrical Stimulation;Neuromuscular re-education;Balance training;Therapeutic exercise;Therapeutic activities;Functional mobility training;Patient/family education;Passive range of motion;Dry needling;Energy conservation;Joint  Manipulations;Spinal Manipulations;Vestibular;Manual techniques;Aquatic Therapy;ADLs/Self Care Home Management    PT Next Visit Plan Progress static and dynamic balance exercises (obstacles with head turns and toe taps), progress strengthening (standing hip strengthening exercises)    PT Home Exercise Plan [email or web address]    Consulted and Agree with Plan of Care Patient;Family member/caregiver  Family Member Consulted Husband            HEP includes the following:  Access Code: [email or web address] URL: [email or web address] Date: 05/26/2021 Prepared by: Ellin Goodie  Exercises Sit to Stand - 1 x daily - 3 x weekly - 3 sets - 10 reps Hooklying Clamshell with Resistance - 1 x daily - 3 x weekly - 3 sets - 10 reps Supine Active Straight Leg Raise - 1 x daily - 3 x weekly - 3 sets - 10 reps Semi-tandem Horizontal and Vertical Head Turns 3 x 10 x 7 days per week    Patient will benefit from skilled therapeutic intervention in order to improve the following deficits and impairments:  Decreased balance, Decreased strength, Decreased range of motion, Decreased endurance, Abnormal gait, Difficulty walking, Impaired flexibility, Impaired sensation, Obesity, Decreased mobility, Increased edema  Visit Diagnosis: Difficulty in walking, not elsewhere classified  Repeated falls     Problem List Patient Active Problem List   Diagnosis Date Noted   Gastroesophageal reflux disease 03/17/2020   Venous (peripheral) insufficiency 03/17/2020   Chronic bilateral low back pain without sciatica 01/07/2020   Benign essential hypertension 12/31/2019   BMI 45.0-49.9, adult (HCC) 12/31/2019   DDD (degenerative disc disease), lumbosacral 12/31/2019   Dependent edema 12/31/2019   Heterozygous protein S deficiency (HCC) 12/31/2019   Hyperlipidemia 12/31/2019   Hypertonicity of bladder 12/31/2019   Hypothyroidism 12/31/2019   Lymphedema, not elsewhere classified 12/31/2019   Osteoporosis 12/31/2019    Other long term (current) drug therapy 12/31/2019   Vitamin D deficiency 12/31/2019   Weight gain 12/31/2019   Neuropathic pain 04/16/2019   Varicose veins of leg with edema, bilateral 03/07/2019   Long-term use of high-risk medication 10/03/2016   Primary osteoarthritis involving multiple joints 10/03/2016   Amaurosis fugax of right eye 06/29/2016   Osteopenia of left thigh 03/30/2016   Edema 03/01/2015   Polyarthralgia 03/01/2015   Rheumatoid arthritis involving multiple sites with positive rheumatoid factor (HCC) 03/01/2015   Rheumatoid arthritis, unspecified (HCC) 03/01/2015   Ellin Goodie PT, DPT  05/26/2021, 8:29 AM  [place] [facility] [address] [place], [address] Phone: [phone]   Fax:  [phone]  Name: Savhanna Sliva MRN: [id number] Date of Birth: [birth year]

## 2021-05-30 ENCOUNTER — Ambulatory Visit: Payer: Medicare HMO | Admitting: Physical Therapy

## 2021-05-30 DIAGNOSIS — R262 Difficulty in walking, not elsewhere classified: Secondary | ICD-10-CM | POA: Diagnosis not present

## 2021-05-30 DIAGNOSIS — R296 Repeated falls: Secondary | ICD-10-CM | POA: Diagnosis not present

## 2021-05-30 NOTE — Therapy (Signed)
[place] [facility] [address] [place], [address] Phone: [phone]   Fax:  [phone]  Physical Therapy Treatment  Patient Details  Name: Kristen Marquez MRN: [id number] Date of Birth: 02-13-1951 Referring Provider (PT): Kennedy Bucker MD   Encounter Date: 05/30/2021   PT End of Session - 05/30/21 1505     Visit Number 4    Number of Visits 11    Date for PT Re-Evaluation 06/21/21    PT Start Time 1500    PT Stop Time 1545    PT Time Calculation (min) 45 min    Equipment Utilized During Treatment Gait belt    Activity Tolerance Patient tolerated treatment well    Behavior During Therapy [facility] for tasks assessed/performed             Past Medical History:  Diagnosis Date   Arthritis    High cholesterol    Hypertension    Thyroid disease     No past surgical history on file.  There were no vitals filed for this visit.   Subjective Assessment - 05/30/21 1503     Subjective Pt reports that she started to experiencing a catching in her left hip that she describes happened when her left hip started to really hurt.    Patient is accompained by: Family member    Pertinent History Bronstein  5. Arthritis. Is followed by rheumatology. Is on Methotrexate weekly and Mobic daily. MRI a few months ago showed advanced arthritis in the left hip, with maceration of the labrum. She also had muscle tears in the gluteal and groin. She denies having pain in the area except after starting PT recently. She saw Orthopedics, but they are hesitant to consider hip replacement surgery due to her significant leg edema.    Limitations Other (comment);Walking   Difficult with balance   How long can you sit comfortably? N/a; feels uncomfortable getting up    How long can you stand comfortably? N/a she stands more than she sits.    How long can you walk comfortably? She reports not having difficulty with her endurance    Diagnostic  tests MRI    Patient Stated Goals Wants to feel less unsteady to walk better    Currently in Pain? No/denies            NMR:  10 meters vertical head turns x 4 10 meters horizontal head turns x 4  10 meters horizontal head turns with 3 cone weaves x 4  10 meters vertical head turns with 3 cone weaves x 4  10 meters with 3 AW step overs x 4  10 meters with 6 AW step overs with  3 cone weave loop x 4  THEREX:   Sitting Hip Abduction with Black TB 2 x 10  Standing Marches 2 x 10 with BUE support    Updated HEP and educated patient on changes to exercises and addition of new exercises sitting hip abduction with black theraband and standing marches.       PT Education - 05/30/21 1532     Education Details form/technique with exercise    Person(s) Educated Patient;Spouse    Methods Explanation;Demonstration    Comprehension Verbalized understanding;Returned demonstration;Verbal cues required              PT Short Term Goals - 05/30/21 1530       PT SHORT TERM GOAL #1   Title Patient will demonstrate understanding of  home exercise plan.    Time 5    Period Weeks    Status New    Target Date 06/21/21               PT Long Term Goals - 05/30/21 1530       PT LONG TERM GOAL #1   Title Patient will have improved function and activity level as evidenced by an increase in FOTO score by 10 points or more.    Baseline 9/6: 37/50    Time 5    Period Weeks    Status New      PT LONG TERM GOAL #2   Title Patient will demonstrate reduced falls risk as evidenced by Dynamic Gait Index (DGI) >19/24.    Baseline 05/17/21: 14/24    Time 5    Period Weeks    Status New      PT LONG TERM GOAL #3   Title Pt will increase by at least 0.13 m/s in order to demonstrate clinically significant improvement in community ambulation.    Baseline 05/17/21: 0.77 m/sec with use of rollator    Time 5    Period Weeks    Status New                   Plan -  05/30/21 1529     Clinical Impression Statement Pt presents for f/u for imbalance and LE weakness. She exhibits improvement with dynamic balance with ability to perform step overs and cone weaves without losing balance and increasing her hip pain. Her home exercises were modified so that patient could better perform exercises without having difficulty getting into positions or causing pain. She will continue to benefit from skilled PT to increase her LE strength and endurance, vestibular function, and static and dynamic balance to decrease her risk of falling and to decrease caregiver burden.    Personal Factors and Comorbidities Age;Comorbidity 2    Comorbidities Arthiritis, left hip pain, HLD, HTN, obesity    Examination-Activity Limitations Locomotion Level    Examination-Participation Restrictions Cleaning;Shop;Community Activity;Other   Naviagting home space   Stability/Clinical Decision Making Stable/Uncomplicated    Rehab Potential Good    PT Frequency 2x / week    PT Duration Other (comment)   5   PT Treatment/Interventions DME Instruction;Gait training;Stair training;Electrical Stimulation;Neuromuscular re-education;Balance training;Therapeutic exercise;Therapeutic activities;Functional mobility training;Patient/family education;Passive range of motion;Dry needling;Energy conservation;Joint Manipulations;Spinal Manipulations;Vestibular;Manual techniques;Aquatic Therapy;ADLs/Self Care Home Management    PT Next Visit Plan Progress static and dynamic balance exercises (obstacles with head turns and toe taps), progress strengthening (standing hip strengthening exercises)    PT Home Exercise Plan [email or web address]    Consulted and Agree with Plan of Care Patient;Family member/caregiver    Family Member Consulted Husband            HEP includes following:   Access Code: [email or web address] URL: [email or web address] Date: 05/30/2021 Prepared by: Ellin Goodie  Exercises Sit to Stand - 1  x daily - 3 x weekly - 3 sets - 10 reps Seated Hip Abduction with Resistance - 1 x daily - 3 x weekly - 3 sets - 10 reps Standing March with Counter Support - 1 x daily - 3 x weekly - 3 sets - 10 reps   Patient will benefit from skilled therapeutic intervention in order to improve the following deficits and impairments:  Decreased balance, Decreased strength, Decreased range of motion, Decreased endurance, Abnormal gait, Difficulty walking, Impaired flexibility, Impaired sensation, Obesity, Decreased mobility, Increased edema  Visit Diagnosis: Difficulty in walking, not elsewhere classified  Repeated falls     Problem List Patient Active Problem List   Diagnosis Date Noted   Gastroesophageal reflux disease 03/17/2020   Venous (peripheral) insufficiency 03/17/2020   Chronic bilateral low back pain without sciatica 01/07/2020   Benign essential hypertension 12/31/2019   BMI 45.0-49.9, adult (HCC) 12/31/2019   DDD (degenerative disc disease), lumbosacral 12/31/2019   Dependent edema 12/31/2019   Heterozygous protein S deficiency (HCC) 12/31/2019   Hyperlipidemia 12/31/2019   Hypertonicity of bladder 12/31/2019   Hypothyroidism 12/31/2019   Lymphedema, not elsewhere classified 12/31/2019   Osteoporosis 12/31/2019   Other long term (current) drug therapy 12/31/2019   Vitamin D deficiency 12/31/2019   Weight gain 12/31/2019   Neuropathic pain 04/16/2019   Varicose veins of leg with edema, bilateral 03/07/2019   Long-term use of high-risk medication 10/03/2016   Primary osteoarthritis involving multiple joints 10/03/2016   Amaurosis fugax of right eye 06/29/2016   Osteopenia of left thigh 03/30/2016   Edema 03/01/2015   Polyarthralgia 03/01/2015   Rheumatoid arthritis involving multiple sites with positive rheumatoid factor (HCC) 03/01/2015   Rheumatoid arthritis, unspecified (HCC) 03/01/2015   Ellin Goodie PT, DPT  05/30/2021, 5:10 PM  [place] [facility] [address], [place], [address] Phone: [phone]   Fax:  [phone]  Name: Kristen Marquez MRN: [id number] Date of Birth: 1950/10/18

## 2021-06-01 ENCOUNTER — Ambulatory Visit: Payer: Medicare HMO | Admitting: Physical Therapy

## 2021-06-01 DIAGNOSIS — R262 Difficulty in walking, not elsewhere classified: Secondary | ICD-10-CM | POA: Diagnosis not present

## 2021-06-01 DIAGNOSIS — R296 Repeated falls: Secondary | ICD-10-CM

## 2021-06-01 NOTE — Therapy (Signed)
[place] [facility] [address], [place], [address] Phone: [phone]   Fax:  [phone]  Physical Therapy Treatment  Patient Details  Name: Kristen Marquez MRN: [id number] Date of Birth: 12-01-1950 Referring Provider (PT): Kennedy Bucker MD   Encounter Date: 06/01/2021   PT End of Session - 06/01/21 1509     Visit Number 5    Number of Visits 11    Date for PT Re-Evaluation 06/21/21    PT Start Time 1500    PT Stop Time 1545    PT Time Calculation (min) 45 min    Equipment Utilized During Treatment Gait belt    Activity Tolerance Patient tolerated treatment well    Behavior During Therapy [facility] for tasks assessed/performed             Past Medical History:  Diagnosis Date   Arthritis    High cholesterol    Hypertension    Thyroid disease     No past surgical history on file.  There were no vitals filed for this visit.   Subjective Assessment - 06/01/21 1506     Subjective Pt reports no falls or near falls since last visit, and that she has a slight catch in her left hip but she was able to do all her exercises    Patient is accompained by: Family member    Pertinent History Bronstein  5. Arthritis. Is followed by rheumatology. Is on Methotrexate weekly and Mobic daily. MRI a few months ago showed advanced arthritis in the left hip, with maceration of the labrum. She also had muscle tears in the gluteal and groin. She denies having pain in the area except after starting PT recently. She saw Orthopedics, but they are hesitant to consider hip replacement surgery due to her significant leg edema.    Limitations Other (comment);Walking   Difficult with balance   How long can you sit comfortably? N/a; feels uncomfortable getting up    How long can you stand comfortably? N/a she stands more than she sits.    How long can you walk comfortably? She reports not having difficulty with her endurance    Diagnostic  tests MRI    Patient Stated Goals Wants to feel less unsteady to walk better    Currently in Pain? No/denies              NMR:   Semi-tandem Eyes Open 2 x 30 sec    Sem-tandem Eyes Open Horizontal Head Turns  2 x 10  -Moderate Sway and increase use of ankle strategy    Sem-tandem Eyes Open Vertical Head Turns 2 x10  -Moderate Sway and increase use of ankle strategy   Semi-tandem Eyes Closed 2 x 30 sec  -Moderate sway hip> ankle    Semi-tandem Eyes Closed Horizontal Head Turns 2 x 10  - Mod to Severe Sway    Hip>Ankle  Strategies    Semi-tandem Eyes Closed Vertical Head Turns 2 x 10  - Severe Sway with intermittent use of hands         Toe Taps onto 6 inch yoga block with 1 UE support 3 x 10         Use of rollator for all gait tasks         10 m horizontal head turns x 6        -min VC to maintain straight path  10 m vertical head turns x 6        -min VC to maintain straight path         10 ft, 17 ft, 24 ft cone pivot turns  x 2                10 ft, 17 ft, 24 ft cone pivot turns with vertical head turns          -min VC to maintain in straight line          10 ft, 17 ft, 24 ft cone pivot turns with horizontal head turns           -min VC to maintain in straight line     Updated HEP and educated patient on changes to exercises and addition of new exercises 10 ft x 10 x 7 days for vertical and horizontal head turns       PT Education - 06/01/21 1508     Education Details form/technique with exercise    Person(s) Educated Patient;Spouse    Methods Explanation;Demonstration;Verbal cues;Handout    Comprehension Verbalized understanding;Verbal cues required;Returned demonstration              PT Short Term Goals - 06/01/21 1510       PT SHORT TERM GOAL #1   Title Patient will demonstrate understanding of home exercise plan.    Time 5    Period Weeks    Status New    Target Date 06/21/21               PT Long Term Goals -  06/01/21 1511       PT LONG TERM GOAL #1   Title Patient will have improved function and activity level as evidenced by an increase in FOTO score by 10 points or more.    Baseline 9/6: 37/50    Time 5    Period Weeks    Status New      PT LONG TERM GOAL #2   Title Patient will demonstrate reduced falls risk as evidenced by Dynamic Gait Index (DGI) >19/24.    Baseline 05/17/21: 14/24    Time 5    Period Weeks    Status New      PT LONG TERM GOAL #3   Title Pt will increase by at least 0.13 m/s in order to demonstrate clinically significant improvement in community ambulation.    Baseline 05/17/21: 0.77 m/sec with use of rollator    Time 5    Period Weeks    Status New                   Plan - 06/01/21 1510     Clinical Impression Statement Pt presents for f/u for imbalance and LE weakness. She continues to make progress with dynamic gait and aerobic endurance with ability to complete pivot turns with head turns. She still demonstrates difficulty with static balance especially with left foot placed back and when her eyes are closed. She did not experience any increase in her left hip pain during the visit.  She will continue to benefit from skilled PT to increase her LE strength and endurance, vestibular function, and static and dynamic balance to decrease her risk of falling and to maintain her independence as a caregiver.    Personal Factors and Comorbidities Age;Comorbidity 2    Comorbidities Arthiritis, left hip pain, HLD, HTN, obesity    Examination-Activity Limitations Locomotion Level  Examination-Participation Restrictions Cleaning;Shop;Community Activity;Other   Naviagting home space   Stability/Clinical Decision Making Stable/Uncomplicated    Rehab Potential Good    PT Frequency 2x / week    PT Duration Other (comment)   5   PT Treatment/Interventions DME Instruction;Gait training;Stair training;Electrical Stimulation;Neuromuscular re-education;Balance  training;Therapeutic exercise;Therapeutic activities;Functional mobility training;Patient/family education;Passive range of motion;Dry needling;Energy conservation;Joint Manipulations;Spinal Manipulations;Vestibular;Manual techniques;Aquatic Therapy;ADLs/Self Care Home Management    PT Next Visit Plan Progress dynamic balance exercises to include pivot turns, obstacles, and vertical head turns. Assess gait speed and static balance.    PT Home Exercise Plan [email or web address]    Consulted and Agree with Plan of Care Patient;Family member/caregiver    Family Member Consulted Husband             Patient will benefit from skilled therapeutic intervention in order to improve the following deficits and impairments:  Decreased balance, Decreased strength, Decreased range of motion, Decreased endurance, Abnormal gait, Difficulty walking, Impaired flexibility, Impaired sensation, Obesity, Decreased mobility, Increased edema  Visit Diagnosis: Difficulty in walking, not elsewhere classified  Repeated falls     Problem List Patient Active Problem List   Diagnosis Date Noted   Gastroesophageal reflux disease 03/17/2020   Venous (peripheral) insufficiency 03/17/2020   Chronic bilateral low back pain without sciatica 01/07/2020   Benign essential hypertension 12/31/2019   BMI 45.0-49.9, adult (HCC) 12/31/2019   DDD (degenerative disc disease), lumbosacral 12/31/2019   Dependent edema 12/31/2019   Heterozygous protein S deficiency (HCC) 12/31/2019   Hyperlipidemia 12/31/2019   Hypertonicity of bladder 12/31/2019   Hypothyroidism 12/31/2019   Lymphedema, not elsewhere classified 12/31/2019   Osteoporosis 12/31/2019   Other long term (current) drug therapy 12/31/2019   Vitamin D deficiency 12/31/2019   Weight gain 12/31/2019   Neuropathic pain 04/16/2019   Varicose veins of leg with edema, bilateral 03/07/2019   Long-term use of high-risk medication 10/03/2016   Primary osteoarthritis involving  multiple joints 10/03/2016   Amaurosis fugax of right eye 06/29/2016   Osteopenia of left thigh 03/30/2016   Edema 03/01/2015   Polyarthralgia 03/01/2015   Rheumatoid arthritis involving multiple sites with positive rheumatoid factor (HCC) 03/01/2015   Rheumatoid arthritis, unspecified (HCC) 03/01/2015   Ellin Goodie PT, DPT  06/01/2021, 3:55 PM  [place] [facility] [address], [place], [address] Phone: [phone]   Fax:  [phone]  Name: Myriam Brandhorst MRN: [id number] Date of Birth: 20-Feb-1951

## 2021-06-06 ENCOUNTER — Ambulatory Visit: Payer: Medicare HMO | Admitting: Physical Therapy

## 2021-06-06 DIAGNOSIS — R296 Repeated falls: Secondary | ICD-10-CM | POA: Diagnosis not present

## 2021-06-06 DIAGNOSIS — R262 Difficulty in walking, not elsewhere classified: Secondary | ICD-10-CM

## 2021-06-06 NOTE — Therapy (Signed)
[facility] [address], [place], [address] Phone: [phone]   Fax:  [phone]  Physical Therapy Treatment  Patient Details  Name: Kristen Marquez MRN: [id number] Date of Birth: 12-Sep-1950 Referring Provider (PT): Kennedy Bucker MD   Encounter Date: 06/06/2021   PT End of Session - 06/06/21 1504     Visit Number 6    Number of Visits 11    Date for PT Re-Evaluation 06/21/21    PT Start Time 1500    PT Stop Time 1545    PT Time Calculation (min) 45 min    Equipment Utilized During Treatment Gait belt    Activity Tolerance Patient tolerated treatment well    Behavior During Therapy [facility] for tasks assessed/performed             Past Medical History:  Diagnosis Date   Arthritis    High cholesterol    Hypertension    Thyroid disease     No past surgical history on file.  There were no vitals filed for this visit.   Subjective Assessment - 06/06/21 1503     Subjective Pt reports no falls or near falls since last visit, and that she has a slight catch in her left hip but she was able to do all her exercises    Patient is accompained by: Family member    Pertinent History Bronstein  5. Arthritis. Is followed by rheumatology. Is on Methotrexate weekly and Mobic daily. MRI a few months ago showed advanced arthritis in the left hip, with maceration of the labrum. She also had muscle tears in the gluteal and groin. She denies having pain in the area except after starting PT recently. She saw Orthopedics, but they are hesitant to consider hip replacement surgery due to her significant leg edema.    Limitations Other (comment);Walking   Difficult with balance   How long can you sit comfortably? N/a; feels uncomfortable getting up    How long can you stand comfortably? N/a she stands more than she sits.    How long can you walk comfortably? She reports not having difficulty with her endurance    Diagnostic  tests MRI    Patient Stated Goals Wants to feel less unsteady to walk better    Currently in Pain? No/denies            Physical Performance:  10 Meter Walk Test   Gait Speed:    1st trial 13 sec , 2nd trial 13  sec - Normal Gait speed Avg= 0.77  m/sec   > 1 m/sec [employer] and cross street safely and normal WS<1 m/sec Need Intervention to reduce falls risk  0.12 m/sec improvement represents a statistically significant improvement in gait speed   d likelihood of dependence with ADLs.  - 0.4 - 0.79 m/sec - limited community ambulator - associated with need for intervention to reduce falls; increased risk for LE limitation and death and hospitalization in 1 year; increased dependence for personal care; 2-year mortality and morbidity; increased functional impairments, severe walking disability   NMR: Using rollator for all unless otherwise stated   10 m horizontal head turns x 6           10 m vertical head turns x 6   10 m vertical and horizontal head turns x 6   10 m over 4  AW obstacles x 6 -min VC to step over obstacle and not around  10 m over 4 AW obstacle + 1 shoe box X 2  -Pt needed to significantly slow down to step over shoe box   Toe Taps onto 4 inch step with 1 UE support 3 x 10   10 m walk with vertical head turns without use of rollator x 2  -CGA with intermittent use of UE support   10 m walk with horizontal head turns without use of rollator x 2 -CGA with intermittent use of UE support   Updated HEP and educated patient on changes to exercises and addition of a new exercise that includes toe taps.        PT Education - 06/06/21 1503     Education Details form/technique with exercise    Person(s) Educated Patient    Methods Explanation;Demonstration;Verbal cues;Handout    Comprehension Verbalized understanding;Returned demonstration;Verbal cues required;Tactile cues required              PT Short Term Goals - 06/06/21 1538        PT SHORT TERM GOAL #1   Title Patient will demonstrate understanding of home exercise plan.    Time 5    Period Weeks    Status New    Target Date 06/21/21               PT Long Term Goals - 06/06/21 1538       PT LONG TERM GOAL #1   Title Patient will have improved function and activity level as evidenced by an increase in FOTO score by 10 points or more.    Baseline 9/6: 37/50    Time 5    Period Weeks    Status New      PT LONG TERM GOAL #2   Title Patient will demonstrate reduced falls risk as evidenced by Dynamic Gait Index (DGI) >19/24.    Baseline 05/17/21: 14/24    Time 5    Period Weeks    Status New      PT LONG TERM GOAL #3   Title Pt will increase by at least 0.13 m/s in order to demonstrate clinically significant improvement in community ambulation.    Baseline 05/17/21: 0.77 m/sec with use of rollator    Time 5    Period Weeks    Status New                   Plan - 06/06/21 1504     Clinical Impression Statement Pt presents for imbalance and LE weakness. She demonstrates an improvement in her dynamic gait with ability to perform head turns with minor to no deviations in walking. Pt exhibits difficulty with right hip flexion with compensation with right external rotation of foot.  She will continue to benefit from skilled PT to increase her LE strength and endurance, vestibular function, and static and dynamic balance to decrease her risk of falling and to maintain her independence as a caregiver.    Personal Factors and Comorbidities Age;Comorbidity 2    Comorbidities Arthiritis, left hip pain, HLD, HTN, obesity    Examination-Activity Limitations Locomotion Level    Examination-Participation Restrictions Cleaning;Shop;Community Activity;Other   Naviagting home space   Stability/Clinical Decision Making Stable/Uncomplicated    Rehab Potential Good    PT Frequency 2x / week    PT Duration Other (comment)   5   PT Treatment/Interventions  DME Instruction;Gait training;Stair training;Electrical Stimulation;Neuromuscular re-education;Balance training;Therapeutic exercise;Therapeutic activities;Functional mobility training;Patient/family education;Passive range of motion;Dry needling;Energy conservation;Joint Manipulations;Spinal Manipulations;Vestibular;Manual techniques;Aquatic Therapy;ADLs/Self  Care Home Management    PT Next Visit Plan Progress stair exercises and dynamic gait exercises.    PT Home Exercise Plan [email or web address]    Consulted and Agree with Plan of Care Patient;Family member/caregiver    Family Member Consulted Husband            HEP includes the following:  Access Code: [email or web address] URL: [email or web address] Date: 06/06/2021 Prepared by: Ellin Goodie  Exercises Sit to Stand - 1 x daily - 3 x weekly - 3 sets - 10 reps Seated Hip Abduction with Resistance - 1 x daily - 3 x weekly - 3 sets - 10 reps Standing March with Counter Support - 1 x daily - 3 x weekly - 3 sets - 10 reps Standing Toe Taps - 1 x daily - 3 x weekly - 3 sets - 10 reps   Patient will benefit from skilled therapeutic intervention in order to improve the following deficits and impairments:  Decreased balance, Decreased strength, Decreased range of motion, Decreased endurance, Abnormal gait, Difficulty walking, Impaired flexibility, Impaired sensation, Obesity, Decreased mobility, Increased edema  Visit Diagnosis: Difficulty in walking, not elsewhere classified  Repeated falls   Problem List Patient Active Problem List   Diagnosis Date Noted   Gastroesophageal reflux disease 03/17/2020   Venous (peripheral) insufficiency 03/17/2020   Chronic bilateral low back pain without sciatica 01/07/2020   Benign essential hypertension 12/31/2019   BMI 45.0-49.9, adult (HCC) 12/31/2019   DDD (degenerative disc disease), lumbosacral 12/31/2019   Dependent edema 12/31/2019   Heterozygous protein S deficiency (HCC) 12/31/2019    Hyperlipidemia 12/31/2019   Hypertonicity of bladder 12/31/2019   Hypothyroidism 12/31/2019   Lymphedema, not elsewhere classified 12/31/2019   Osteoporosis 12/31/2019   Other long term (current) drug therapy 12/31/2019   Vitamin D deficiency 12/31/2019   Weight gain 12/31/2019   Neuropathic pain 04/16/2019   Varicose veins of leg with edema, bilateral 03/07/2019   Long-term use of high-risk medication 10/03/2016   Primary osteoarthritis involving multiple joints 10/03/2016   Amaurosis fugax of right eye 06/29/2016   Osteopenia of left thigh 03/30/2016   Edema 03/01/2015   Polyarthralgia 03/01/2015   Rheumatoid arthritis involving multiple sites with positive rheumatoid factor (HCC) 03/01/2015   Rheumatoid arthritis, unspecified (HCC) 03/01/2015   Ellin Goodie PT, DPT  06/06/2021, 4:19 PM  [facility] [address], [place], [address] Phone: [phone]   Fax:  [phone]  Name: Gaynel Schaafsma MRN: [id number] Date of Birth: 05/13/1951

## 2021-06-08 ENCOUNTER — Ambulatory Visit: Payer: Medicare HMO | Admitting: Physical Therapy

## 2021-06-08 DIAGNOSIS — R262 Difficulty in walking, not elsewhere classified: Secondary | ICD-10-CM | POA: Diagnosis not present

## 2021-06-08 DIAGNOSIS — R296 Repeated falls: Secondary | ICD-10-CM

## 2021-06-08 NOTE — Therapy (Addendum)
[facility] [address], [place], [address] Phone: [phone]   Fax:  [phone]  Physical Therapy Treatment  Patient Details  Name: Kristen Marquez MRN: [id number] Date of Birth: 1950-10-21 Referring Provider (PT): Kennedy Bucker MD   Encounter Date: 06/08/2021   PT End of Session - 06/08/21 1504     Visit Number 7    Number of Visits 11    Date for PT Re-Evaluation 06/21/21    PT Start Time 1500    PT Stop Time 1545    PT Time Calculation (min) 45 min    Equipment Utilized During Treatment Gait belt    Activity Tolerance Patient tolerated treatment well    Behavior During Therapy [facility] for tasks assessed/performed             Past Medical History:  Diagnosis Date   Arthritis    High cholesterol    Hypertension    Thyroid disease     No past surgical history on file.  There were no vitals filed for this visit.   Subjective Assessment - 06/08/21 1502     Subjective Pt reports increased right sided neck pain. She said that it pained her all night and it only improved when she placed pressure on her right side.    Patient is accompained by: Family member    Pertinent History Bronstein  5. Arthritis. Is followed by rheumatology. Is on Methotrexate weekly and Mobic daily. MRI a few months ago showed advanced arthritis in the left hip, with maceration of the labrum. She also had muscle tears in the gluteal and groin. She denies having pain in the area except after starting PT recently. She saw Orthopedics, but they are hesitant to consider hip replacement surgery due to her significant leg edema.    Limitations Other (comment);Walking   Difficult with balance   How long can you sit comfortably? N/a; feels uncomfortable getting up    How long can you stand comfortably? N/a she stands more than she sits.    How long can you walk comfortably? She reports not having difficulty with her endurance     Diagnostic tests MRI    Patient Stated Goals Wants to feel less unsteady to walk better    Currently in Pain? No/denies            MANUAL:  Upper trap massage and trigger point release  on right   THEREX:   Adjusted walker height to wrist level   Forward step ups onto 4 inch platform 2 feet on platform with 1 UE support  2 x 10  Forward step overs onto 4 inch platform 1 foot on platform with 1 UE support 2 x 10   NMR:  10 m gait speed (fast + slow) 10 m horizontal head turns x 4  10 m vertical head turns x 4  10 m head turns + change in gait speed x 6       Updated HEP and educated patient on changes to exercises and addition of new exercise that includes forward step up.                  PT Education - 06/08/21 1503     Education Details form/technique with exercise    Person(s) Educated Patient    Methods Explanation;Demonstration;Tactile cues;Verbal cues;Handout    Comprehension Verbalized understanding;Returned demonstration;Verbal cues required  PT Short Term Goals - 06/08/21 1551       PT SHORT TERM GOAL #1   Title Patient will demonstrate understanding of home exercise plan.    Time 5    Period Weeks    Status New    Target Date 06/21/21               PT Long Term Goals - 06/08/21 1551       PT LONG TERM GOAL #1   Title Patient will have improved function and activity level as evidenced by an increase in FOTO score by 10 points or more.    Baseline 9/6: 37/50    Time 5    Period Weeks    Status New      PT LONG TERM GOAL #2   Title Patient will demonstrate reduced falls risk as evidenced by Dynamic Gait Index (DGI) >19/24.    Baseline 05/17/21: 14/24    Time 5    Period Weeks    Status New      PT LONG TERM GOAL #3   Title Pt will increase by at least 0.13 m/s in order to demonstrate clinically significant improvement in community ambulation.    Baseline 05/17/21: 0.77 m/sec with use of rollator     Time 5    Period Weeks    Status New             HEP includes the following:  Access Code: [email or web address] URL: [email or web address] Date: 06/08/2021 Prepared by: Ellin Goodie  Exercises Sit to Stand - 1 x daily - 3 x weekly - 3 sets - 10 reps Seated Hip Abduction with Resistance - 1 x daily - 3 x weekly - 3 sets - 10 reps Standing March with Counter Support - 1 x daily - 3 x weekly - 3 sets - 10 reps Standing Toe Taps - 1 x daily - 3 x weekly - 3 sets - 10 reps Step Up - 1 x daily - 3 x weekly - 2 sets - 10 reps       Plan - 06/08/21 1548     Clinical Impression Statement Pt presents for f/u for imbalance and LE weakness. She exhibits an improvement in LE strength with ability to perform step ups and step overs with min to mod compensation. She also shows improvement with dynamic gait with ability to perform head turns and change in gait speed with little to no gait deviations. Pt benefited from upper trap massage that has likely been aggravated by increased use of rollator and rollator height not being properly adjusted. PT adjusted height of rollator to fit pt which resulted in decreased shoulder elevation.  She will continue to benefit from skilled PT to increase her LE strength and endurance, vestibular function, and static and dynamic balance to decrease her risk of falling and to maintain her independence as a caregiver.    Personal Factors and Comorbidities Age;Comorbidity 2    Comorbidities Arthiritis, left hip pain, HLD, HTN, obesity    Examination-Activity Limitations Locomotion Level    Examination-Participation Restrictions Cleaning;Shop;Community Activity;Other   Naviagting home space   Stability/Clinical Decision Making Stable/Uncomplicated    Rehab Potential Good    PT Frequency 2x / week    PT Duration Other (comment)   5   PT Treatment/Interventions DME Instruction;Gait training;Stair training;Electrical Stimulation;Neuromuscular re-education;Balance  training;Therapeutic exercise;Therapeutic activities;Functional mobility training;Patient/family education;Passive range of motion;Dry needling;Energy conservation;Joint Manipulations;Spinal Manipulations;Vestibular;Manual techniques;Aquatic Therapy;ADLs/Self Care Home Management    PT Next  Visit Plan Progress stair exercises (utilize stairs) and progress dynamic gait exercises.    PT Home Exercise Plan [email or web address]    Consulted and Agree with Plan of Care Patient;Family member/caregiver    Family Member Consulted Husband             Patient will benefit from skilled therapeutic intervention in order to improve the following deficits and impairments:  Decreased balance, Decreased strength, Decreased range of motion, Decreased endurance, Abnormal gait, Difficulty walking, Impaired flexibility, Impaired sensation, Obesity, Decreased mobility, Increased edema  Visit Diagnosis: Difficulty in walking, not elsewhere classified  Repeated falls     Problem List Patient Active Problem List   Diagnosis Date Noted   Gastroesophageal reflux disease 03/17/2020   Venous (peripheral) insufficiency 03/17/2020   Chronic bilateral low back pain without sciatica 01/07/2020   Benign essential hypertension 12/31/2019   BMI 45.0-49.9, adult (HCC) 12/31/2019   DDD (degenerative disc disease), lumbosacral 12/31/2019   Dependent edema 12/31/2019   Heterozygous protein S deficiency (HCC) 12/31/2019   Hyperlipidemia 12/31/2019   Hypertonicity of bladder 12/31/2019   Hypothyroidism 12/31/2019   Lymphedema, not elsewhere classified 12/31/2019   Osteoporosis 12/31/2019   Other long term (current) drug therapy 12/31/2019   Vitamin D deficiency 12/31/2019   Weight gain 12/31/2019   Neuropathic pain 04/16/2019   Varicose veins of leg with edema, bilateral 03/07/2019   Long-term use of high-risk medication 10/03/2016   Primary osteoarthritis involving multiple joints 10/03/2016   Amaurosis fugax of right eye  06/29/2016   Osteopenia of left thigh 03/30/2016   Edema 03/01/2015   Polyarthralgia 03/01/2015   Rheumatoid arthritis involving multiple sites with positive rheumatoid factor (HCC) 03/01/2015   Rheumatoid arthritis, unspecified (HCC) 03/01/2015   Ellin Goodie PT, DPT  06/08/2021, 3:56 PM  [facility] [address], [place], [address] Phone: [phone]   Fax:  [phone]  Name: Starsha Morning MRN: [id number] Date of Birth: May 02, 1951

## 2021-06-13 ENCOUNTER — Ambulatory Visit: Payer: Medicare HMO | Attending: Orthopedic Surgery

## 2021-06-13 DIAGNOSIS — R296 Repeated falls: Secondary | ICD-10-CM | POA: Insufficient documentation

## 2021-06-13 DIAGNOSIS — R262 Difficulty in walking, not elsewhere classified: Secondary | ICD-10-CM | POA: Insufficient documentation

## 2021-06-13 NOTE — Therapy (Signed)
[facility] [address] [place], [address] Phone: [phone]   Fax:  [phone]  Physical Therapy Treatment  Patient Details  Name: Candence Sease MRN: [id number] Date of Birth: 18-Feb-1951 Referring Provider (PT): Kennedy Bucker MD   Encounter Date: 06/13/2021   PT End of Session - 06/13/21 1338     Visit Number 8    Number of Visits 11    Date for PT Re-Evaluation 06/21/21    Authorization Type [employer]    PT Start Time 1332    PT Stop Time 1412    PT Time Calculation (min) 40 min    Equipment Utilized During Treatment Gait belt    Activity Tolerance Patient tolerated treatment well    Behavior During Therapy WFL for tasks assessed/performed             Past Medical History:  Diagnosis Date   Arthritis    High cholesterol    Hypertension    Thyroid disease     No past surgical history on file.  There were no vitals filed for this visit.   Subjective Assessment - 06/13/21 1336     Subjective Pt reports her neck finally is feeling improved, however it got in the way of her doing her HEP over the weekend. She denies any other updates since past PT visits.    Pertinent History Pt followed by rheumatology. Is on Methotrexate weekly and Mobic daily. MRI a few months ago showed advanced arthritis in the left hip, with maceration of the labrum. She also had muscle tears in the gluteal and groin. She denies having pain in the area except after starting PT recently. She saw Orthopedics, but they are hesitant to consider hip replacement surgery due to her significant leg edema.    Currently in Pain? No/denies             INTERVENTION THIS DATE:  *performed on treadmill for BUE support rails PRN MinA for stepup/stepdown  Semi-tandem Eyes Open 2 x 30 sec  Semi-tandem Eyes Open Horizontal Head Turns  (too difficult)  Normal stance Horizontal head turns x20 1(sec hold or less)  Normal stance  horiztonal head turns 10x3-5sec H  Normal stance head turns vertical x20  Marching in place x20 Lateral side stepping 1x12 steps bilat   FWD step ups/step downs x12 (3" step)  *rest breaks  Lateral steps/downs x16 (3"step)  6"step taps 1x6 Rt; 1x8 Lt  *rest 6"step taps 1x6 Rt; 1x8 Lt    PT Education - 06/13/21 1338     Education Details Details on gait training    Person(s) Educated Patient    Methods Explanation    Comprehension Verbalized understanding              PT Short Term Goals - 06/08/21 1551       PT SHORT TERM GOAL #1   Title Patient will demonstrate understanding of home exercise plan.    Time 5    Period Weeks    Status New    Target Date 06/21/21               PT Long Term Goals - 06/08/21 1551       PT LONG TERM GOAL #1   Title Patient will have improved function and activity level as evidenced by an increase in FOTO score by 10 points or more.    Baseline 9/6: 37/50    Time 5  Period Weeks    Status New      PT LONG TERM GOAL #2   Title Patient will demonstrate reduced falls risk as evidenced by Dynamic Gait Index (DGI) >19/24.    Baseline 05/17/21: 14/24    Time 5    Period Weeks    Status New      PT LONG TERM GOAL #3   Title Pt will increase by at least 0.13 m/s in order to demonstrate clinically significant improvement in community ambulation.    Baseline 05/17/21: 0.77 m/sec with use of rollator    Time 5    Period Weeks    Status New                   Plan - 06/13/21 1349     Clinical Impression Statement Continued with current plan of care as laid out in evaluation and recent prior sessions. Pt remains motivated to advance progress toward goals in order to maximize independence and safety at home. Pt requires high level assistance and cuing for completion of exercises in order to provide adequate level of stimulation and perturbation. Author allows pt as much opportunity as possible to perform independent  righting strategies, only stepping in when pt is unable to prevent falling to floor. Pt continues to demonstrate progress toward goals AEB progression of some interventions this date either in volume or intensity.    Personal Factors and Comorbidities Age;Comorbidity 2    Comorbidities Arthiritis, left hip pain, HLD, HTN, obesity    Examination-Activity Limitations Locomotion Level    Examination-Participation Restrictions Cleaning;Shop;Community Activity;Other    Stability/Clinical Decision Making Stable/Uncomplicated    Clinical Decision Making Moderate    Rehab Potential Good    PT Frequency 2x / week    PT Duration --   5 weeks   PT Treatment/Interventions DME Instruction;Gait training;Stair training;Electrical Stimulation;Neuromuscular re-education;Balance training;Therapeutic exercise;Therapeutic activities;Functional mobility training;Patient/family education;Passive range of motion;Dry needling;Energy conservation;Joint Manipulations;Spinal Manipulations;Vestibular;Manual techniques;Aquatic Therapy;ADLs/Self Care Home Management    PT Next Visit Plan Progress stair exercises (utilize stairs) and progress dynamic gait exercises.    PT Home Exercise Plan [email or web address]    Consulted and Agree with Plan of Care Patient;Family member/caregiver    Family Member Consulted Husband             Patient will benefit from skilled therapeutic intervention in order to improve the following deficits and impairments:  Decreased balance, Decreased strength, Decreased range of motion, Decreased endurance, Abnormal gait, Difficulty walking, Impaired flexibility, Impaired sensation, Obesity, Decreased mobility, Increased edema  Visit Diagnosis: Difficulty in walking, not elsewhere classified  Repeated falls     Problem List Patient Active Problem List   Diagnosis Date Noted   Gastroesophageal reflux disease 03/17/2020   Venous (peripheral) insufficiency 03/17/2020   Chronic bilateral low back pain  without sciatica 01/07/2020   Benign essential hypertension 12/31/2019   BMI 45.0-49.9, adult (HCC) 12/31/2019   DDD (degenerative disc disease), lumbosacral 12/31/2019   Dependent edema 12/31/2019   Heterozygous protein S deficiency (HCC) 12/31/2019   Hyperlipidemia 12/31/2019   Hypertonicity of bladder 12/31/2019   Hypothyroidism 12/31/2019   Lymphedema, not elsewhere classified 12/31/2019   Osteoporosis 12/31/2019   Other long term (current) drug therapy 12/31/2019   Vitamin D deficiency 12/31/2019   Weight gain 12/31/2019   Neuropathic pain 04/16/2019   Varicose veins of leg with edema, bilateral 03/07/2019   Long-term use of high-risk medication 10/03/2016   Primary osteoarthritis involving multiple joints 10/03/2016   Amaurosis fugax  of right eye 06/29/2016   Osteopenia of left thigh 03/30/2016   Edema 03/01/2015   Polyarthralgia 03/01/2015   Rheumatoid arthritis involving multiple sites with positive rheumatoid factor (HCC) 03/01/2015   Rheumatoid arthritis, unspecified (HCC) 03/01/2015   2:09 PM, 06/13/21 Rosamaria Lints, PT, DPT Physical Therapist - [place] [phone] (Office)   [place] C, PT 06/13/2021, 2:01 PM  [place] PHYSICAL AND SPORTS MEDICINE [address], [place], [address] Phone: [phone]   Fax:  [phone]  Name: Cova Knieriem MRN: [id number] Date of Birth: 12/28/1950

## 2021-06-15 ENCOUNTER — Ambulatory Visit: Payer: Medicare HMO | Admitting: Physical Therapy

## 2021-06-15 DIAGNOSIS — R296 Repeated falls: Secondary | ICD-10-CM

## 2021-06-15 DIAGNOSIS — R262 Difficulty in walking, not elsewhere classified: Secondary | ICD-10-CM

## 2021-06-15 NOTE — Therapy (Signed)
[facility] PHYS[facility] AND SPORTS MEDICINE [address] [place], [address] Phone: [phone]   Fax:  [phone]  Physical Therapy Treatment  Patient Details  Name: Kristen Marquez MRN: [id number] Date of Birth: [birth year] Referring Provider (PT): Hessie Knows MD   Encounter Date: 06/15/2021   PT End of Session - 06/15/21 1332     Visit Number 9    Number of Visits 11    Date for PT Re-Evaluation 06/21/21    Authorization Type [employer]    PT Start Time 1330    PT Stop Time 1415    PT Time Calculation (min) 45 min    Equipment Utilized During Treatment Gait belt    Activity Tolerance Patient tolerated treatment well    Behavior During Therapy WFL for tasks assessed/performed             Past Medical History:  Diagnosis Date   Arthritis    High cholesterol    Hypertension    Thyroid disease     No past surgical history on file.  There were no vitals filed for this visit.   Subjective Assessment - 06/15/21 1330     Subjective Pt states that she has been doing well with exercises and that she has not fallen since last visit. She feels ready to reassess her goals and feels more confident in her balance.    Pertinent History Pt followed by rheumatology. Is on Methotrexate weekly and Mobic daily. MRI a few months ago showed advanced arthritis in the left hip, with maceration of the labrum. She also had muscle tears in the gluteal and groin. She denies having pain in the area except after starting PT recently. She saw Orthopedics, but they are hesitant to consider hip replacement surgery due to her significant leg edema.    Currently in Pain? No/denies            THEREX:   10 Meter Walk Test   Gait Speed:    1st trial 11.36 sec , 2nd trial 11.56 , Avg=   11.46sec - Normal Gait speed Avg=  0.87 m/sec    <1 m/sec Need Intervention to reduce falls risk  0.12 m/sec improvement represents a statistically significant  improvement in gait speed    - 0.8 - 1.3 m/sec - community ambulator - associated with increased independence in self-care   DGI: 19/24 <= 19/24 predictive of falls  - Item 1: Mild Impairment  -Item 5: Mild Impairment  -Item 6: Mild Impairment  -Item 8: Moderate Impairment    Discussion about HEP and how to progress exercises to continue to maintain strength and balance gains. Progressions explained in the HEP handout given to patient.      PT Education - 06/15/21 1436     Education Details form and technique with exercise and explanation of exercise progression    Person(s) Educated Patient    Methods Explanation;Demonstration;Verbal cues;Handout    Comprehension Verbalized understanding;Returned demonstration;Verbal cues required;Need further instruction              PT Short Term Goals - 06/15/21 1429       PT SHORT TERM GOAL #1   Title Patient will demonstrate understanding of home exercise plan.    Time 5    Period Weeks    Status Achieved    Target Date 06/21/21               PT Long Term Goals - 06/15/21 1429  PT LONG TERM GOAL #1   Title Patient will have improved function and activity level as evidenced by an increase in FOTO score by 10 points or more.    Baseline 9/6: 37/50 10/5: 47/50    Time 5    Period Weeks    Status Achieved      PT LONG TERM GOAL #2   Title Patient will demonstrate reduced falls risk as evidenced by Dynamic Gait Index (DGI) >19/24.    Baseline 05/17/21: 14/24 06/15/21: 19/24    Time 5    Period Weeks    Status Partially Met      PT LONG TERM GOAL #3   Title Pt will increase 10MWT by at least 0.13 m/s in order to demonstrate clinically significant improvement in community ambulation.    Baseline 05/17/21: 0.77 m/sec with use of rollator 10/5: 0.87 m/sec    Time 5    Period Weeks    Status Partially Met                   Plan - 06/15/21 1333     Clinical Impression Statement Pt presents for f/u and  progress note for imbalance. She has nearly met all her goals with exception of gait speed and DGI, which she is very close to meeting. Pt demonstrates improved ability to maintain straight path with head turns. Pt indicates that she is ready for d/c and HEP and progression of HEP were reviewed for pt to maintain progress. PT recommends ongoing medical management for lymphedema and to pursue aerobic exercise with husband at [facility] to increase aerobic activity.    Personal Factors and Comorbidities Age;Comorbidity 2    Comorbidities Arthiritis, left hip pain, HLD, HTN, obesity    Examination-Activity Limitations Locomotion Level    Examination-Participation Restrictions Cleaning;Shop;Community Activity;Other   Naviagting home space   Stability/Clinical Decision Making Stable/Uncomplicated    Rehab Potential Good    PT Frequency 2x / week    PT Duration Other (comment)   5   PT Treatment/Interventions DME Instruction;Gait training;Stair training;Electrical Stimulation;Neuromuscular re-education;Balance training;Therapeutic exercise;Therapeutic activities;Functional mobility training;Patient/family education;Passive range of motion;Dry needling;Energy conservation;Joint Manipulations;Spinal Manipulations;Vestibular;Manual techniques;Aquatic Therapy;ADLs/Self Care Home Management    PT Next Visit Plan N/a    PT Home Exercise Plan [email or web address]    Recommended Other Services Further medical management of lymphedema    Consulted and Agree with Plan of Care Patient;Family member/caregiver    Family Member Consulted Husband            HEP includes following:  Access Code: [email or web address] URL: [email or web address] Date: 06/15/2021 Prepared by: Bradly Chris  Exercises Sit to Stand - 1 x daily - 3 x weekly - 3 sets - 10 reps Seated Hip Abduction with Resistance - 1 x daily - 3 x weekly - 3 sets - 10 reps Standing March with Counter Support - 1 x daily - 3 x weekly - 3 sets - 10 reps Standing  Toe Taps - 1 x daily - 3 x weekly - 3 sets - 10 reps Step Up - 1 x daily - 3 x weekly - 2 sets - 10 reps   Patient will benefit from skilled therapeutic intervention in order to improve the following deficits and impairments:  Decreased balance, Decreased strength, Decreased range of motion, Decreased endurance, Abnormal gait, Difficulty walking, Impaired flexibility, Impaired sensation, Obesity, Decreased mobility, Increased edema  Visit Diagnosis: Difficulty in walking, not elsewhere classified  Repeated falls     Problem List Patient Active Problem  List   Diagnosis Date Noted   Gastroesophageal reflux disease 03/17/2020   Venous (peripheral) insufficiency 03/17/2020   Chronic bilateral low back pain without sciatica 01/07/2020   Benign essential hypertension 12/31/2019   BMI 45.0-49.9, adult ([place]) 12/31/2019   DDD (degenerative disc disease), lumbosacral 12/31/2019   Dependent edema 12/31/2019   Heterozygous protein S deficiency ([place]) 12/31/2019   Hyperlipidemia 12/31/2019   Hypertonicity of bladder 12/31/2019   Hypothyroidism 12/31/2019   Lymphedema, not elsewhere classified 12/31/2019   Osteoporosis 12/31/2019   Other long term (current) drug therapy 12/31/2019   Vitamin D deficiency 12/31/2019   Weight gain 12/31/2019   Neuropathic pain 04/16/2019   Varicose veins of leg with edema, bilateral 03/07/2019   Long-term use of high-risk medication 10/03/2016   Primary osteoarthritis involving multiple joints 10/03/2016   Amaurosis fugax of right eye 06/29/2016   Osteopenia of left thigh 03/30/2016   Edema 03/01/2015   Polyarthralgia 03/01/2015   Rheumatoid arthritis involving multiple sites with positive rheumatoid factor ([place]) 03/01/2015   Rheumatoid arthritis, unspecified ([place]) 03/01/2015   Bradly Chris PT, DPT  06/15/2021, 2:45 PM  [facility] PH[facility] SPORTS MEDICINE [address], [place], [address] Phone:  [phone]   Fax:  [phone]  Name: Itzelle Gains MRN: [id number] Date of Birth: 22-Jul-1951

## 2021-06-21 ENCOUNTER — Ambulatory Visit: Payer: Medicare HMO | Admitting: Physical Therapy

## 2021-06-23 ENCOUNTER — Ambulatory Visit: Payer: Medicare HMO | Admitting: Physical Therapy

## 2021-06-27 ENCOUNTER — Encounter: Payer: Medicare HMO | Admitting: Physical Therapy

## 2021-06-29 ENCOUNTER — Encounter: Payer: Medicare HMO | Admitting: Physical Therapy

## 2021-07-04 ENCOUNTER — Encounter: Payer: Medicare HMO | Admitting: Physical Therapy

## 2021-07-06 ENCOUNTER — Encounter: Payer: Medicare HMO | Admitting: Physical Therapy

## 2021-07-11 ENCOUNTER — Encounter: Payer: Medicare HMO | Admitting: Physical Therapy

## 2021-07-14 ENCOUNTER — Encounter: Payer: Medicare HMO | Admitting: Physical Therapy

## 2021-07-18 ENCOUNTER — Encounter: Payer: Medicare HMO | Admitting: Physical Therapy

## 2021-07-20 ENCOUNTER — Encounter: Payer: Medicare HMO | Admitting: Physical Therapy

## 2021-07-21 DIAGNOSIS — M0579 Rheumatoid arthritis with rheumatoid factor of multiple sites without organ or systems involvement: Secondary | ICD-10-CM | POA: Diagnosis not present

## 2021-07-21 DIAGNOSIS — Z7960 Long term (current) use of unspecified immunomodulators and immunosuppressants: Secondary | ICD-10-CM | POA: Diagnosis not present

## 2021-07-21 DIAGNOSIS — M159 Polyosteoarthritis, unspecified: Secondary | ICD-10-CM | POA: Diagnosis not present

## 2021-07-25 ENCOUNTER — Encounter: Payer: Medicare HMO | Admitting: Physical Therapy

## 2021-07-27 ENCOUNTER — Encounter: Payer: Medicare HMO | Admitting: Physical Therapy

## 2021-07-27 DIAGNOSIS — H16223 Keratoconjunctivitis sicca, not specified as Sjogren's, bilateral: Secondary | ICD-10-CM | POA: Diagnosis not present

## 2021-08-01 ENCOUNTER — Encounter: Payer: Medicare HMO | Admitting: Physical Therapy

## 2021-08-03 ENCOUNTER — Encounter: Payer: Medicare HMO | Admitting: Physical Therapy

## 2021-08-08 ENCOUNTER — Encounter: Payer: Medicare HMO | Admitting: Physical Therapy

## 2021-08-10 ENCOUNTER — Encounter: Payer: Medicare HMO | Admitting: Physical Therapy

## 2021-08-11 ENCOUNTER — Ambulatory Visit (INDEPENDENT_AMBULATORY_CARE_PROVIDER_SITE_OTHER): Payer: Medicare HMO | Admitting: Vascular Surgery

## 2021-08-15 ENCOUNTER — Ambulatory Visit (INDEPENDENT_AMBULATORY_CARE_PROVIDER_SITE_OTHER): Payer: Medicare HMO | Admitting: Vascular Surgery

## 2021-08-18 ENCOUNTER — Ambulatory Visit (INDEPENDENT_AMBULATORY_CARE_PROVIDER_SITE_OTHER): Payer: Medicare HMO | Admitting: Vascular Surgery

## 2021-08-22 ENCOUNTER — Other Ambulatory Visit: Payer: Self-pay

## 2021-08-22 ENCOUNTER — Ambulatory Visit (INDEPENDENT_AMBULATORY_CARE_PROVIDER_SITE_OTHER): Payer: Medicare HMO | Admitting: Vascular Surgery

## 2021-08-22 VITALS — BP 148/81 | HR 90 | Ht 61.0 in | Wt 262.0 lb

## 2021-08-22 DIAGNOSIS — K219 Gastro-esophageal reflux disease without esophagitis: Secondary | ICD-10-CM

## 2021-08-22 DIAGNOSIS — I10 Essential (primary) hypertension: Secondary | ICD-10-CM | POA: Diagnosis not present

## 2021-08-22 DIAGNOSIS — E785 Hyperlipidemia, unspecified: Secondary | ICD-10-CM | POA: Diagnosis not present

## 2021-08-22 DIAGNOSIS — I890 Lymphedema, not elsewhere classified: Secondary | ICD-10-CM | POA: Diagnosis not present

## 2021-08-22 NOTE — Progress Notes (Signed)
MRN : [id number]  Kristen Marquez is a 70 y.o. (1950-12-27) female who presents with chief complaint of leg swelling.  History of Present Illness:   The patient returns to the office for followup evaluation regarding leg swelling.  The swelling has persisted but with the lymph pump is much, much better controlled. The pain associated with swelling is essentially eliminated. There have not been any interval development of a ulcerations or wounds.   The patient denies problems with the pump, noting it is working well and the leggings are in good condition.   Since the previous visit the patient has been wearing graduated compression stockings and using the lymph pump on a routine basis and  has noted significant improvement in the lymphedema.    Patient stated the lymph pump has been a very positive factor in her care.   Current Meds  Medication Sig   amLODipine (NORVASC) 5 MG tablet Take by mouth.   amoxicillin-clavulanate (AUGMENTIN) 875-125 MG tablet Take 1 tablet by mouth 2 (two) times daily.   aspirin 81 MG chewable tablet Chew by mouth.   atorvastatin (LIPITOR) 40 MG tablet Take by mouth.   budesonide-formoterol (SYMBICORT) 160-4.5 MCG/ACT inhaler Inhale into the lungs.   Cholecalciferol 50 MCG (2000 UT) CAPS Take by mouth.   doxycycline (VIBRAMYCIN) 100 MG capsule Take 1 capsule (100 mg total) by mouth 2 (two) times daily.   folic acid (FOLVITE) 1 MG tablet Take by mouth.   furosemide (LASIX) 20 MG tablet Take by mouth.   HYDROcodone-homatropine (HYCODAN) 5-1.5 MG/5ML syrup Take by mouth.   meclizine (ANTIVERT) 25 MG tablet Take by mouth.   meloxicam (MOBIC) 7.5 MG tablet Take by mouth.   methotrexate (RHEUMATREX) 2.5 MG tablet Take by mouth.   metoprolol tartrate (LOPRESSOR) 25 MG tablet Take by mouth.   ondansetron (ZOFRAN ODT) 4 MG disintegrating tablet Take 1 tablet (4 mg total) by mouth every 8 (eight) hours as needed for nausea or vomiting.   predniSONE (DELTASONE) 10 MG  tablet Take 4 tabs daily for 3 days, then 3 tabs daily x 3 days, then 2 tabs daily for 3 days, then 1 tab daily x 3 days.   simvastatin (ZOCOR) 20 MG tablet    telmisartan (MICARDIS) 80 MG tablet     Past Medical History:  Diagnosis Date   Arthritis    High cholesterol    Hypertension    Thyroid disease     No past surgical history on file.  Social History Social History   Tobacco Use   Smoking status: Former    Types: Cigarettes    Start date: 07/23/1995   Smokeless tobacco: Never  Substance Use Topics   Alcohol use: Yes   Drug use: Never    Family History Family History  Problem Relation Age of Onset   Breast cancer Sister     Allergies  Allergen Reactions   Diltiazem     Other reaction(s): Headache, Other (see comments) Other reaction(s): Other (See Comments) Headache Headache Headache Headache Severe headache    Diltiazem Hcl Other (See Comments)    Headache Headache      REVIEW OF SYSTEMS (Negative unless checked)  Constitutional: [] Weight loss  [] Fever  [] Chills Cardiac: [] Chest pain   [] Chest pressure   [] Palpitations   [] Shortness of breath when laying flat   [] Shortness of breath with exertion. Vascular:  [] Pain in legs with walking   [] Pain in legs at rest  [] History of DVT   [] Phlebitis   [  x]Swelling in legs   [] Varicose veins   [] Non-healing ulcers Pulmonary:   [] Uses home oxygen   [] Productive cough   [] Hemoptysis   [] Wheeze  [] COPD   [] Asthma Neurologic:  [] Dizziness   [] Seizures   [] History of stroke   [] History of TIA  [] Aphasia   [] Vissual changes   [] Weakness or numbness in arm   [] Weakness or numbness in leg Musculoskeletal:   [] Joint swelling   [] Joint pain   [] Low back pain Hematologic:  [] Easy bruising  [] Easy bleeding   [] Hypercoagulable state   [] Anemic Gastrointestinal:  [] Diarrhea   [] Vomiting  [x] Gastroesophageal reflux/heartburn   [] Difficulty swallowing. Genitourinary:  [x] Chronic kidney disease   [] Difficult urination   [] Frequent urination   [] Blood in urine Skin:  [] Rashes   [] Ulcers  Psychological:  [] History of anxiety   []  History of major depression.  Physical Examination  Vitals:   08/22/21 1040  BP: (!) 148/81  Pulse: 90  Weight: 262 lb (118.8 kg)  Height: 5\' 1"  (1.549 m)   Body mass index is 49.5 kg/m. Gen: WD/WN, NAD Head: [place]/AT, No temporalis wasting.  Ear/Nose/Throat: Hearing grossly intact, nares w/o erythema or drainage, pinna without lesions Eyes: PER, EOMI, sclera nonicteric.  Neck: Supple, no gross masses.  No JVD.  Pulmonary:  Good air movement, no audible wheezing, no use of accessory muscles.  Cardiac: RRR, precordium not hyperdynamic. Vascular:  scattered varicosities present bilaterally.  Moderate venous stasis changes to the legs bilaterally.  3+ soft pitting edema  Vessel Right Left  Radial Palpable Palpable  Gastrointestinal: soft, non-distended. No guarding/no peritoneal signs.  Musculoskeletal: M/S 5/5 throughout.  No deformity.  Neurologic: CN 2-12 intact. Pain and light touch intact in extremities.  Symmetrical.  Speech is fluent. Motor exam as listed above. Psychiatric: Judgment intact, Mood & affect appropriate for pt's clinical situation. Dermatologic: Moderate venous rashes no ulcers noted.  No changes consistent with cellulitis. Lymph : No lichenification or skin changes of chronic lymphedema.  CBC Lab Results  Component Value Date   WBC 6.7 09/18/2020   HGB 12.0 09/18/2020   HCT 37.2 09/18/2020   MCV 99.2 09/18/2020   PLT 254 09/18/2020    BMET    Component Value Date/Time   NA 137 09/18/2020 1449   K 3.6 09/18/2020 1449   CL 99 09/18/2020 1449   CO2 27 09/18/2020 1449   GLUCOSE 107 (H) 09/18/2020 1449   BUN 18 09/18/2020 1449   CREATININE 0.97 09/18/2020 1449   CALCIUM 9.5 09/18/2020 1449   GFRNONAA >60 09/18/2020 1449   CrCl cannot be calculated (Patient's most recent lab result is older than the maximum 21 days allowed.).  COAG No  results found for: INR, PROTIME  Radiology No results found.   Assessment/Plan 1. Lymphedema, not elsewhere classified  No surgery or intervention at this point in time.    I have reviewed my discussion with the patient regarding lymphedema and why it  causes symptoms.  Patient will continue wearing graduated compression stockings class 1 (20-30 mmHg) on a daily basis a prescription was given. The patient is reminded to put the stockings on first thing in the morning and removing them in the evening. The patient is instructed specifically not to sleep in the stockings.   In addition, behavioral modification throughout the day will be continued.  This will include frequent elevation (such as in a recliner), use of over the counter pain medications as needed and exercise such as walking.  I have reviewed systemic causes  for chronic edema such as liver, kidney and cardiac etiologies and there does not appear to be any significant changes in these organ systems over the past year.  The patient is under the impression that these organ systems are all stable and unchanged.    The patient will continue aggressive use of the  lymph pump.  This will continue to improve the edema control and prevent sequela such as ulcers and infections.   The patient will follow-up with me on an annual basis.    2. Hyperlipidemia, unspecified hyperlipidemia type Continue statin as ordered and reviewed, no changes at this time   3. Benign essential hypertension Continue antihypertensive medications as already ordered, these medications have been reviewed and there are no changes at this time.   4. Gastroesophageal reflux disease, unspecified whether esophagitis present Continue PPI as already ordered, this medication has been reviewed and there are no changes at this time.  Avoidence of caffeine and alcohol  Moderate elevation of the head of the bed      Levora Dredge, MD  08/22/2021 10:46 AM

## 2021-08-23 DIAGNOSIS — H2513 Age-related nuclear cataract, bilateral: Secondary | ICD-10-CM | POA: Diagnosis not present

## 2021-08-27 ENCOUNTER — Encounter (INDEPENDENT_AMBULATORY_CARE_PROVIDER_SITE_OTHER): Payer: Self-pay | Admitting: Vascular Surgery

## 2021-08-29 DIAGNOSIS — R3915 Urgency of urination: Secondary | ICD-10-CM | POA: Diagnosis not present

## 2021-08-29 DIAGNOSIS — I890 Lymphedema, not elsewhere classified: Secondary | ICD-10-CM | POA: Diagnosis not present

## 2021-08-29 DIAGNOSIS — E785 Hyperlipidemia, unspecified: Secondary | ICD-10-CM | POA: Diagnosis not present

## 2021-08-29 DIAGNOSIS — E039 Hypothyroidism, unspecified: Secondary | ICD-10-CM | POA: Diagnosis not present

## 2021-08-29 DIAGNOSIS — M0579 Rheumatoid arthritis with rheumatoid factor of multiple sites without organ or systems involvement: Secondary | ICD-10-CM | POA: Diagnosis not present

## 2021-08-29 DIAGNOSIS — I10 Essential (primary) hypertension: Secondary | ICD-10-CM | POA: Diagnosis not present

## 2021-08-29 DIAGNOSIS — K219 Gastro-esophageal reflux disease without esophagitis: Secondary | ICD-10-CM | POA: Diagnosis not present

## 2021-09-22 DIAGNOSIS — M5440 Lumbago with sciatica, unspecified side: Secondary | ICD-10-CM | POA: Diagnosis not present

## 2021-09-22 DIAGNOSIS — M9903 Segmental and somatic dysfunction of lumbar region: Secondary | ICD-10-CM | POA: Diagnosis not present

## 2021-09-22 DIAGNOSIS — M546 Pain in thoracic spine: Secondary | ICD-10-CM | POA: Diagnosis not present

## 2021-09-22 DIAGNOSIS — M9902 Segmental and somatic dysfunction of thoracic region: Secondary | ICD-10-CM | POA: Diagnosis not present

## 2021-09-27 DIAGNOSIS — H16223 Keratoconjunctivitis sicca, not specified as Sjogren's, bilateral: Secondary | ICD-10-CM | POA: Diagnosis not present

## 2021-09-29 DIAGNOSIS — R059 Cough, unspecified: Secondary | ICD-10-CM | POA: Diagnosis not present

## 2021-10-11 DIAGNOSIS — H16223 Keratoconjunctivitis sicca, not specified as Sjogren's, bilateral: Secondary | ICD-10-CM | POA: Diagnosis not present

## 2021-10-25 DIAGNOSIS — H16223 Keratoconjunctivitis sicca, not specified as Sjogren's, bilateral: Secondary | ICD-10-CM | POA: Diagnosis not present

## 2021-11-01 DIAGNOSIS — R809 Proteinuria, unspecified: Secondary | ICD-10-CM | POA: Diagnosis not present

## 2021-11-01 DIAGNOSIS — E7800 Pure hypercholesterolemia, unspecified: Secondary | ICD-10-CM | POA: Diagnosis not present

## 2021-11-01 DIAGNOSIS — E663 Overweight: Secondary | ICD-10-CM | POA: Diagnosis not present

## 2021-11-01 DIAGNOSIS — D631 Anemia in chronic kidney disease: Secondary | ICD-10-CM | POA: Diagnosis not present

## 2021-11-01 DIAGNOSIS — I10 Essential (primary) hypertension: Secondary | ICD-10-CM | POA: Diagnosis not present

## 2021-11-01 DIAGNOSIS — N1831 Chronic kidney disease, stage 3a: Secondary | ICD-10-CM | POA: Diagnosis not present

## 2021-11-01 DIAGNOSIS — N179 Acute kidney failure, unspecified: Secondary | ICD-10-CM | POA: Diagnosis not present

## 2021-11-01 DIAGNOSIS — R8290 Unspecified abnormal findings in urine: Secondary | ICD-10-CM | POA: Diagnosis not present

## 2021-11-10 DIAGNOSIS — N179 Acute kidney failure, unspecified: Secondary | ICD-10-CM | POA: Diagnosis not present

## 2021-11-10 DIAGNOSIS — I10 Essential (primary) hypertension: Secondary | ICD-10-CM | POA: Diagnosis not present

## 2021-11-10 DIAGNOSIS — E7800 Pure hypercholesterolemia, unspecified: Secondary | ICD-10-CM | POA: Diagnosis not present

## 2021-11-10 DIAGNOSIS — R809 Proteinuria, unspecified: Secondary | ICD-10-CM | POA: Diagnosis not present

## 2021-11-10 DIAGNOSIS — E663 Overweight: Secondary | ICD-10-CM | POA: Diagnosis not present

## 2021-11-10 DIAGNOSIS — R8290 Unspecified abnormal findings in urine: Secondary | ICD-10-CM | POA: Diagnosis not present

## 2021-11-10 DIAGNOSIS — N1831 Chronic kidney disease, stage 3a: Secondary | ICD-10-CM | POA: Diagnosis not present

## 2021-11-10 DIAGNOSIS — M0539 Rheumatoid heart disease with rheumatoid arthritis of multiple sites: Secondary | ICD-10-CM | POA: Diagnosis not present

## 2021-11-10 DIAGNOSIS — D631 Anemia in chronic kidney disease: Secondary | ICD-10-CM | POA: Diagnosis not present

## 2021-11-23 DIAGNOSIS — Z7960 Long term (current) use of unspecified immunomodulators and immunosuppressants: Secondary | ICD-10-CM | POA: Diagnosis not present

## 2021-11-23 DIAGNOSIS — M0579 Rheumatoid arthritis with rheumatoid factor of multiple sites without organ or systems involvement: Secondary | ICD-10-CM | POA: Diagnosis not present

## 2021-11-23 DIAGNOSIS — M654 Radial styloid tenosynovitis [de Quervain]: Secondary | ICD-10-CM | POA: Diagnosis not present

## 2021-11-23 DIAGNOSIS — M159 Polyosteoarthritis, unspecified: Secondary | ICD-10-CM | POA: Diagnosis not present

## 2021-12-07 DIAGNOSIS — R059 Cough, unspecified: Secondary | ICD-10-CM | POA: Diagnosis not present

## 2021-12-21 DIAGNOSIS — N179 Acute kidney failure, unspecified: Secondary | ICD-10-CM | POA: Diagnosis not present

## 2021-12-21 DIAGNOSIS — N1831 Chronic kidney disease, stage 3a: Secondary | ICD-10-CM | POA: Diagnosis not present

## 2021-12-21 DIAGNOSIS — I10 Essential (primary) hypertension: Secondary | ICD-10-CM | POA: Diagnosis not present

## 2021-12-21 DIAGNOSIS — E663 Overweight: Secondary | ICD-10-CM | POA: Diagnosis not present

## 2021-12-21 DIAGNOSIS — R809 Proteinuria, unspecified: Secondary | ICD-10-CM | POA: Diagnosis not present

## 2021-12-21 DIAGNOSIS — R8290 Unspecified abnormal findings in urine: Secondary | ICD-10-CM | POA: Diagnosis not present

## 2021-12-21 DIAGNOSIS — D631 Anemia in chronic kidney disease: Secondary | ICD-10-CM | POA: Diagnosis not present

## 2021-12-21 DIAGNOSIS — E7800 Pure hypercholesterolemia, unspecified: Secondary | ICD-10-CM | POA: Diagnosis not present

## 2021-12-21 DIAGNOSIS — M0539 Rheumatoid heart disease with rheumatoid arthritis of multiple sites: Secondary | ICD-10-CM | POA: Diagnosis not present

## 2022-01-12 DIAGNOSIS — Z6841 Body Mass Index (BMI) 40.0 and over, adult: Secondary | ICD-10-CM | POA: Diagnosis not present

## 2022-01-12 DIAGNOSIS — M069 Rheumatoid arthritis, unspecified: Secondary | ICD-10-CM | POA: Diagnosis not present

## 2022-01-12 DIAGNOSIS — I10 Essential (primary) hypertension: Secondary | ICD-10-CM | POA: Diagnosis not present

## 2022-01-12 DIAGNOSIS — E785 Hyperlipidemia, unspecified: Secondary | ICD-10-CM | POA: Diagnosis not present

## 2022-01-12 DIAGNOSIS — D631 Anemia in chronic kidney disease: Secondary | ICD-10-CM | POA: Diagnosis not present

## 2022-01-12 DIAGNOSIS — M0539 Rheumatoid heart disease with rheumatoid arthritis of multiple sites: Secondary | ICD-10-CM | POA: Diagnosis not present

## 2022-01-12 DIAGNOSIS — N1831 Chronic kidney disease, stage 3a: Secondary | ICD-10-CM | POA: Diagnosis not present

## 2022-01-12 DIAGNOSIS — E663 Overweight: Secondary | ICD-10-CM | POA: Diagnosis not present

## 2022-01-12 DIAGNOSIS — R809 Proteinuria, unspecified: Secondary | ICD-10-CM | POA: Diagnosis not present

## 2022-02-20 ENCOUNTER — Telehealth (INDEPENDENT_AMBULATORY_CARE_PROVIDER_SITE_OTHER): Payer: Self-pay

## 2022-02-20 NOTE — Telephone Encounter (Signed)
A message was left for the patient regarding a prescription for compression hose stating the prescription will be at the front desk.

## 2022-02-21 DIAGNOSIS — H16223 Keratoconjunctivitis sicca, not specified as Sjogren's, bilateral: Secondary | ICD-10-CM | POA: Diagnosis not present

## 2022-02-27 ENCOUNTER — Telehealth (INDEPENDENT_AMBULATORY_CARE_PROVIDER_SITE_OTHER): Payer: Self-pay | Admitting: Vascular Surgery

## 2022-02-27 ENCOUNTER — Other Ambulatory Visit: Payer: Self-pay | Admitting: Family Medicine

## 2022-02-27 DIAGNOSIS — Z1231 Encounter for screening mammogram for malignant neoplasm of breast: Secondary | ICD-10-CM

## 2022-02-27 NOTE — Telephone Encounter (Signed)
I will speak with the provider

## 2022-02-27 NOTE — Telephone Encounter (Signed)
Patient called and wants Dr. Gilda Crease to give her a prescription for compression socks.  She states if she have a prescription for them, she will not have to pay taxes on them.  Please advise.

## 2022-02-28 NOTE — Telephone Encounter (Signed)
Called pt and LVM stating that the order is here please call and let us know if she will pick up in office or if we should put it in the mail.

## 2022-03-28 DIAGNOSIS — M545 Low back pain, unspecified: Secondary | ICD-10-CM | POA: Diagnosis not present

## 2022-03-28 DIAGNOSIS — G8929 Other chronic pain: Secondary | ICD-10-CM | POA: Diagnosis not present

## 2022-03-28 DIAGNOSIS — M159 Polyosteoarthritis, unspecified: Secondary | ICD-10-CM | POA: Diagnosis not present

## 2022-03-28 DIAGNOSIS — M0579 Rheumatoid arthritis with rheumatoid factor of multiple sites without organ or systems involvement: Secondary | ICD-10-CM | POA: Diagnosis not present

## 2022-03-28 DIAGNOSIS — Z7960 Long term (current) use of unspecified immunomodulators and immunosuppressants: Secondary | ICD-10-CM | POA: Diagnosis not present

## 2022-04-05 ENCOUNTER — Ambulatory Visit
Admission: RE | Admit: 2022-04-05 | Discharge: 2022-04-05 | Disposition: A | Discharge status: Home / Self Care | Payer: Medicare HMO | Source: Ambulatory Visit | Attending: Family Medicine | Admitting: Family Medicine

## 2022-04-05 DIAGNOSIS — Z1231 Encounter for screening mammogram for malignant neoplasm of breast: Secondary | ICD-10-CM | POA: Diagnosis not present

## 2022-05-08 DIAGNOSIS — E785 Hyperlipidemia, unspecified: Secondary | ICD-10-CM | POA: Diagnosis not present

## 2022-05-08 DIAGNOSIS — E663 Overweight: Secondary | ICD-10-CM | POA: Diagnosis not present

## 2022-05-08 DIAGNOSIS — I10 Essential (primary) hypertension: Secondary | ICD-10-CM | POA: Diagnosis not present

## 2022-05-08 DIAGNOSIS — M0539 Rheumatoid heart disease with rheumatoid arthritis of multiple sites: Secondary | ICD-10-CM | POA: Diagnosis not present

## 2022-05-08 DIAGNOSIS — R809 Proteinuria, unspecified: Secondary | ICD-10-CM | POA: Diagnosis not present

## 2022-05-08 DIAGNOSIS — D631 Anemia in chronic kidney disease: Secondary | ICD-10-CM | POA: Diagnosis not present

## 2022-05-08 DIAGNOSIS — N1831 Chronic kidney disease, stage 3a: Secondary | ICD-10-CM | POA: Diagnosis not present

## 2022-05-08 DIAGNOSIS — H16223 Keratoconjunctivitis sicca, not specified as Sjogren's, bilateral: Secondary | ICD-10-CM | POA: Diagnosis not present

## 2022-05-17 DIAGNOSIS — R809 Proteinuria, unspecified: Secondary | ICD-10-CM | POA: Diagnosis not present

## 2022-05-17 DIAGNOSIS — E7800 Pure hypercholesterolemia, unspecified: Secondary | ICD-10-CM | POA: Diagnosis not present

## 2022-05-17 DIAGNOSIS — E663 Overweight: Secondary | ICD-10-CM | POA: Diagnosis not present

## 2022-05-17 DIAGNOSIS — M0569 Rheumatoid arthritis of multiple sites with involvement of other organs and systems: Secondary | ICD-10-CM | POA: Diagnosis not present

## 2022-05-17 DIAGNOSIS — I10 Essential (primary) hypertension: Secondary | ICD-10-CM | POA: Diagnosis not present

## 2022-05-17 DIAGNOSIS — D631 Anemia in chronic kidney disease: Secondary | ICD-10-CM | POA: Diagnosis not present

## 2022-05-17 DIAGNOSIS — N1831 Chronic kidney disease, stage 3a: Secondary | ICD-10-CM | POA: Diagnosis not present

## 2022-06-08 DIAGNOSIS — R053 Chronic cough: Secondary | ICD-10-CM | POA: Diagnosis not present

## 2022-06-29 ENCOUNTER — Emergency Department
Admission: EM | Admit: 2022-06-29 | Discharge: 2022-06-29 | Disposition: A | Discharge status: Home / Self Care | Payer: Medicare HMO | Attending: Emergency Medicine | Admitting: Emergency Medicine

## 2022-06-29 ENCOUNTER — Emergency Department: Payer: Medicare HMO

## 2022-06-29 ENCOUNTER — Other Ambulatory Visit: Payer: Self-pay

## 2022-06-29 DIAGNOSIS — M5136 Other intervertebral disc degeneration, lumbar region: Secondary | ICD-10-CM | POA: Diagnosis not present

## 2022-06-29 DIAGNOSIS — M4696 Unspecified inflammatory spondylopathy, lumbar region: Secondary | ICD-10-CM | POA: Diagnosis not present

## 2022-06-29 DIAGNOSIS — M79604 Pain in right leg: Secondary | ICD-10-CM | POA: Diagnosis not present

## 2022-06-29 DIAGNOSIS — M48061 Spinal stenosis, lumbar region without neurogenic claudication: Secondary | ICD-10-CM | POA: Diagnosis not present

## 2022-06-29 DIAGNOSIS — M7989 Other specified soft tissue disorders: Secondary | ICD-10-CM | POA: Diagnosis not present

## 2022-06-29 DIAGNOSIS — M1611 Unilateral primary osteoarthritis, right hip: Secondary | ICD-10-CM | POA: Diagnosis not present

## 2022-06-29 DIAGNOSIS — M79651 Pain in right thigh: Secondary | ICD-10-CM | POA: Insufficient documentation

## 2022-06-29 DIAGNOSIS — M810 Age-related osteoporosis without current pathological fracture: Secondary | ICD-10-CM | POA: Insufficient documentation

## 2022-06-29 DIAGNOSIS — Q7649 Other congenital malformations of spine, not associated with scoliosis: Secondary | ICD-10-CM | POA: Diagnosis not present

## 2022-06-29 DIAGNOSIS — M47816 Spondylosis without myelopathy or radiculopathy, lumbar region: Secondary | ICD-10-CM | POA: Diagnosis not present

## 2022-06-29 DIAGNOSIS — M545 Low back pain, unspecified: Secondary | ICD-10-CM | POA: Diagnosis not present

## 2022-06-29 MED ORDER — METHYLPREDNISOLONE 4 MG PO TBPK: ORAL_TABLET | ORAL | 0 refills | Status: DC

## 2022-06-29 MED ORDER — HYDROCODONE-ACETAMINOPHEN 5-325 MG PO TABS: 1.0000 | ORAL_TABLET | Freq: Four times a day (QID) | ORAL | 0 refills | Status: DC | PRN

## 2022-06-29 MED ORDER — FENTANYL CITRATE PF 50 MCG/ML IJ SOSY
50.0000 ug | PREFILLED_SYRINGE | Freq: Once | INTRAMUSCULAR | Status: AC
  Administered 2022-06-29: 50 ug via INTRAMUSCULAR
  Filled 2022-06-29: qty 1

## 2022-06-29 NOTE — ED Triage Notes (Signed)
Pt presents to ED with c/o of lower back pain and states it is also tingling, pt states HX of sciatica. Pt denies injury or trauma. Pt ambulatory to triage with walker.

## 2022-06-29 NOTE — Discharge Instructions (Signed)
Follow-up with Dr. Cari Caraway.  Please call for an appointment.  He may be able to do steroid injections to help you with your back pain syndrome. Return emergency department if you are worsening

## 2022-06-29 NOTE — ED Notes (Signed)
See triage note. Pt reports chronic R hip and R back pain. States sharp and 10/10 when she moves and 5/10 dull when she isn't moving. Pt in NAD. Family at bedside.

## 2022-06-29 NOTE — ED Provider Triage Note (Signed)
Emergency Medicine Provider Triage Evaluation Note  Kristen Marquez , a 71 y.o. female  was evaluated in triage.  Pt complains of low back pain, right lateral hip pain.  History of sciatica several years ago along with hip osteoarthritis.  Was told she needs a hip replacement.  No falls trauma or injury.  She describes numbness in her right foot with burning in her right thigh.  No fevers.  She is ambulatory with a walker.  Electrical pain in her right hip is worse with standing.  Review of Systems  Positive: Numbness tingling pain right leg Negative: Fall, fevers, abdominal pain  Physical Exam  BP (!) 148/61   Pulse 68   Temp 97.8 F (36.6 C) (Oral)   Resp 17   SpO2 93%  Gen:   Awake, no distress ambulatory with a walker Resp:  Normal effort  MSK:   Moves extremities without difficulty  Other:  Leg lengths equal, no external rotation.  Mild pain with right hip internal rotation  Medical Decision Making  Medically screening exam initiated at 5:54 PM.  Appropriate orders placed.  Orean Giarratano was informed that the remainder of the evaluation will be completed by another provider, this initial triage assessment does not replace that evaluation, and the importance of remaining in the ED until their evaluation is complete.     Duanne Guess, PA-C 06/29/22 1756

## 2022-06-29 NOTE — ED Provider Notes (Signed)
[facility] Provider Note    Event Date/Time   First MD Initiated Contact with Patient 06/29/22 2004     (approximate)   History   Back Pain   HPI  Shakerria Parran is a 71 y.o. female presents emergency department complaining of low back pain and right hip pain.  Was told she needs a hip replacement but they would not do the surgery secondary to her medical problems.  Does have numbness extending into her right foot and burning of the right thigh.  Patient is using a walker.  Took meloxicam without any relief.  No known injury.  No loss of bowel or bladder control.  Patient also has lower leg swelling as she has lymphedema, complaining of pain      Physical Exam   Triage Vital Signs: ED Triage Vitals  Enc Vitals Group     BP 06/29/22 1746 (!) 148/61     Pulse Rate 06/29/22 1746 68     Resp 06/29/22 1746 17     Temp 06/29/22 1746 97.8 F (36.6 C)     Temp Source 06/29/22 1746 Oral     SpO2 06/29/22 1746 93 %     Weight --      Height --      Head Circumference --      Peak Flow --      Pain Score 06/29/22 1745 10     Pain Loc --      Pain Edu? --      Excl. in [place]? --     Most recent vital signs: Vitals:   06/29/22 1746  BP: (!) 148/61  Pulse: 68  Resp: 17  Temp: 97.8 F (36.6 C)  SpO2: 93%     General: Awake, no distress.   CV:  Good peripheral perfusion. regular rate and  rhythm Resp:  Normal effort.  Abd:  No distention.   Other:  Tender along the right hip, SI joint and lumbar spine   ED Results / Procedures / Treatments   Labs (all labs ordered are listed, but only abnormal results are displayed) Labs Reviewed - No data to display    EKG     RADIOLOGY  Ultrasound for DVT, x-ray of the right hip, lumbar spine, CT lumbar spine   PROCEDURES:   Procedures   MEDICATIONS ORDERED IN ED: Medications  fentaNYL (SUBLIMAZE) injection 50 mcg (50 mcg Intramuscular Given 06/29/22 2058)     IMPRESSION / MDM /  [place] / ED COURSE  I reviewed the triage vital signs and the nursing notes.                              Differential diagnosis includes, but is not limited to, compression fracture, bulging disc, cauda equina, occult fracture of the hip, DVT  Patient's presentation is most consistent with acute complicated illness / injury requiring diagnostic workup.   I feel that cauda equina is less likely as patient does not have loss of bowel or bladder control and neurovascular is intact.  Ultrasound for DVT is negative, independently reviewed and interpreted by me  X-ray of the hip independently reviewed and interpreted by me as showing osteoarthritis.  No fracture.  X-ray of the lumbar spine shows a T12 compression fracture  CT lumbar spine to evaluate for compression fracture  Patient given fentanyl 50 mcg IM   CT lumbar spine independently reviewed and interpreted by  me as having large amount of facet disease along with spinal stenosis.  Radiologist also comments on some osteoporosis along with remote T12 compression fracture  Did explain findings to the patient.  Feel that she should follow-up with neurosurgery.  They may be able to do injections to help her with the pain.  She was given a prescription for Medrol Dosepak and hydrocodone.  She is to return emergency department worsening.  Follow-up with her regular doctor as needed.  Continue her regular medications.   FINAL CLINICAL IMPRESSION(S) / ED DIAGNOSES   Final diagnoses:  Facet arthritis of lumbar region  Osteoporosis of vertebra  Spinal stenosis of lumbar region, unspecified whether neurogenic claudication present     Rx / DC Orders   ED Discharge Orders          Ordered    methylPREDNISolone (MEDROL DOSEPAK) 4 MG TBPK tablet        06/29/22 2117    HYDROcodone-acetaminophen (NORCO/VICODIN) 5-325 MG tablet  Every 6 hours PRN        06/29/22 2117             Note:  This document was prepared using  Dragon voice recognition software and may include unintentional dictation errors.    Faythe Ghee, PA-C 06/29/22 2120    Minna Antis, MD 06/29/22 [phone]

## 2022-07-12 DIAGNOSIS — R053 Chronic cough: Secondary | ICD-10-CM | POA: Diagnosis not present

## 2022-07-19 NOTE — Progress Notes (Unsigned)
Referring Physician:  Minna Antis, MD [address] [place],  [place] [address]  Primary Physician:  Dorothey Baseman, MD  History of Present Illness: 07/20/2022 Kristen Marquez has a history of HTN, hyperlipidemia, RA, PVD, obesity, osteoporosis, lymphedema, and CKD.   She was seen in ED on 06/29/22 with LBP and right hip pain. Diagnosed with lumbar facet hypertrophy, spinal stenosis, and remote T12 compression fracture.   Was given medrol dose pack and hydrocodone from ED. No NSAIDs due to CKD.   She is here to discuss referral for possible lumbar injections.   History of chronic LBP that has been worse in last 2 months. She had flareup prior to going to ED with constant LBP with right lateral hip/leg pain along with numbness in right toes/foot. Pain was worse with standing and walking. She has some intermittent feelings of weakness in right leg.   Currently, she has no pain in her back or leg. Time and the medrol dose pack seemed to help.   Conservative measures:  Physical therapy: September 2022 for balance.  Multimodal medical therapy including regular antiinflammatories: medrol dose pack, hydrocodone  Injections: No epidural steroid injections  Past Surgery: no lumbar surgery  Kristen Marquez has no symptoms of cervical myelopathy. She does note some chronic balance issues.   The symptoms are not causing a significant impact on the patient's life.   Review of Systems:  A 10 point review of systems is negative, except for the pertinent positives and negatives detailed in the HPI.  Past Medical History: Past Medical History:  Diagnosis Date   Arthritis    High cholesterol    Hypertension    Thyroid disease     Past Surgical History: No past surgical history on file.  Allergies: Allergies as of 07/20/2022 - Review Complete 07/20/2022  Allergen Reaction Noted   Diltiazem  12/18/2014   Diltiazem hcl Other (See Comments) 12/18/2014     Medications: Outpatient Encounter Medications as of 07/20/2022  Medication Sig   amLODipine (NORVASC) 5 MG tablet Take 5 mg by mouth daily.   aspirin 81 MG chewable tablet Chew 81 mg by mouth daily.   atorvastatin (LIPITOR) 40 MG tablet Take 40 mg by mouth daily.   budesonide-formoterol (SYMBICORT) 160-4.5 MCG/ACT inhaler Inhale 2 puffs into the lungs daily as needed.   Cholecalciferol 50 MCG (2000 UT) CAPS Take 2,000 Units by mouth daily.   folic acid (FOLVITE) 1 MG tablet Take 1 mg by mouth daily.   furosemide (LASIX) 20 MG tablet Take 20 mg by mouth daily.   levothyroxine (SYNTHROID) 100 MCG tablet Take by mouth.   meclizine (ANTIVERT) 25 MG tablet Take by mouth.   meloxicam (MOBIC) 7.5 MG tablet Take 7.5 mg by mouth daily.   methotrexate (RHEUMATREX) 2.5 MG tablet Take 2.5 mg by mouth once a week.   metoprolol tartrate (LOPRESSOR) 25 MG tablet Take 25 mg by mouth 2 (two) times daily.   ondansetron (ZOFRAN ODT) 4 MG disintegrating tablet Take 1 tablet (4 mg total) by mouth every 8 (eight) hours as needed for nausea or vomiting.   telmisartan (MICARDIS) 80 MG tablet    [DISCONTINUED] amoxicillin-clavulanate (AUGMENTIN) 875-125 MG tablet Take 1 tablet by mouth 2 (two) times daily. (Patient not taking: Reported on 07/20/2022)   [DISCONTINUED] doxycycline (VIBRAMYCIN) 100 MG capsule Take 1 capsule (100 mg total) by mouth 2 (two) times daily.   [DISCONTINUED] HYDROcodone-acetaminophen (NORCO/VICODIN) 5-325 MG tablet Take 1 tablet by mouth every 6 (six) hours as  needed for moderate pain.   [DISCONTINUED] HYDROcodone-homatropine (HYCODAN) 5-1.5 MG/5ML syrup Take by mouth.   [DISCONTINUED] methylPREDNISolone (MEDROL DOSEPAK) 4 MG TBPK tablet Take 6 pills on day one then decrease by 1 pill each day (Patient not taking: Reported on 07/20/2022)   [DISCONTINUED] pantoprazole (PROTONIX) 40 MG tablet Take by mouth.   [DISCONTINUED] predniSONE (DELTASONE) 10 MG tablet Take 4 tabs daily for 3 days, then  3 tabs daily x 3 days, then 2 tabs daily for 3 days, then 1 tab daily x 3 days. (Patient not taking: Reported on 07/20/2022)   [DISCONTINUED] simvastatin (ZOCOR) 20 MG tablet    No facility-administered encounter medications on file as of 07/20/2022.    Social History: Social History   Tobacco Use   Smoking status: Former    Types: Cigarettes    Start date: 07/23/1995   Smokeless tobacco: Never  Substance Use Topics   Alcohol use: Yes   Drug use: Never    Family Medical History: Family History  Problem Relation Age of Onset   Breast cancer Sister     Physical Examination: Vitals:   07/20/22 0902  BP: (!) 146/86    General: Patient is well developed, well nourished, calm, collected, and in no apparent distress. Attention to examination is appropriate.  Respiratory: Patient is breathing without any difficulty.   NEUROLOGICAL:     Awake, alert, oriented to person, place, and time.  Speech is clear and fluent. Fund of knowledge is appropriate.   Cranial Nerves: Pupils equal round and reactive to light.  Facial tone is symmetric.  Facial sensation is symmetric.  No lower lumbar tenderness.   No abnormal lesions on exposed skin.   Strength: Side Biceps Triceps Deltoid Interossei Grip Wrist Ext. Wrist Flex.  R 5 5 5 5 5 5 5   L 5 5 5 5 5 5 5    Side Iliopsoas Quads Hamstring PF DF EHL  R 5 5 5 5 5 5   L 5 5 5 5 5 5    Reflexes are 2+ and symmetric at the biceps, triceps, brachioradialis, patella and achilles.   Hoffman's is absent.  Clonus is not present.   Bilateral upper and lower extremity sensation is intact to light touch.     She has lymphedema in both legs.   She ambulates slowly with a rollator.   Medical Decision Making  Imaging: Lumbar xrays 06/29/22:  FINDINGS: Degenerative lumbar spondylosis with multilevel disc disease and facet disease. Multilevel degenerative listhesis due to facet disease. No acute bony findings or destructive bony changes in  the lumbar spine. There is a mild compression deformity of [id number] of uncertain age. Atherosclerotic calcification involving the aorta and iliac arteries but no definite aneurysm. The visualized bony pelvis is intact.   IMPRESSION: 1. Degenerative lumbar spondylosis with multilevel disc disease and facet disease. 2. Mild compression deformity of [id number] of uncertain age.     Electronically Signed   By: Marijo Sanes M.D.   On: 06/29/2022 18:47  Lumbar CT scan dated 06/29/22:  FINDINGS: Segmentation: There are five lumbar type vertebral bodies. The last full intervertebral disc space is labeled L5-S1.   Alignment: Degenerative lumbar spondylosis with multilevel degenerative listhesis due to severe facet disease.   Vertebrae: Significant osteoporosis. No acute lumbar spine fracture is identified. No facet or lamina fractures. There is a mild compression deformity of T12 but no acute fracture.   Paraspinal and other soft tissues: Significant atherosclerotic calcifications involving the aorta and iliac arteries. No aneurysm. No  retroperitoneal process.   Disc levels:   T11-12: Small calcified disc protrusion centrally with minimal impression on the ventral thecal sac. Advanced facet disease but no significant spinal or foraminal stenosis.   T12-L1: Bulging annulus, osteophytic ridging and facet disease but no significant spinal or foraminal stenosis.   L1-2: Bulging uncovered disc, osteophytic ridging and facet disease contributing to mild to moderate spinal and bilateral lateral recess stenosis.   L2-3: Bulging degenerated annulus, osteophytic ridging, short pedicles and facet disease contributing to moderate spinal and bilateral lateral recess stenosis. Mild foraminal encroachment bilaterally also.   L3-4: Bulging uncovered disc, osteophytic ridging, severe facet disease and ligamentum flavum thickening contributing to severe spinal and bilateral lateral recess stenosis.  Moderate bilateral foraminal stenosis.   L4-5: Bulging uncovered disc, severe facet disease and short pedicles contributing to severe spinal and bilateral lateral recess stenosis and moderately severe bilateral foraminal stenosis.   L5-S1: Severe facet disease but no disc protrusions or significant spinal or foraminal stenosis.   IMPRESSION: 1. Degenerative lumbar spondylosis with multilevel degenerative listhesis due to severe facet disease. 2. No acute lumbar spine fracture is identified. Mild remote compression deformities of T12 and L1. 3. Severe spinal and bilateral lateral recess stenosis and moderately severe bilateral foraminal stenosis at L3-4 and L4-5. 4. Moderate spinal and bilateral lateral recess stenosis at L2-3. 5. Mild to moderate spinal and bilateral lateral recess stenosis at L1-2.   Aortic Atherosclerosis (ICD10-I70.0).     Electronically Signed   By: Marijo Sanes M.D.   On: 06/29/2022 20:51  I have personally reviewed the images and agree with the above interpretation. She has a slip at L4-L5 and slight retrolisthesis L1-L2.   Assessment and Plan: Kristen Marquez is a pleasant 71 y.o. female with history of chronic LBP that has been worse in last 2 months. She had flareup prior to going to ED with constant LBP with right lateral hip/leg pain along with numbness in right toes/foot. Pain was worse with standing and walking. She has some intermittent feelings of weakness in right leg.   Since that time, her pain is improved and she currently has no back or leg pain.   She has known slip L4-L5 with severe spinal stenosis L3-L5 and moderate stenosis L2-L3. She has multilevel lumbar DDD and facet hypertrophy. Remote compression deformities T12 and L1.   Treatment options discussed with patient and following plan made:   - Referral to PMR ([facility]) for possible injections when she has a flare up.  - Discussed formal PT, she wants to hold off.  - She will follow up prn.  She likely is not a surgical candidate due to her multiple comorbidities.   I spent a total of 30 minutes in face-to-face and non-face-to-face activities related to this patient's care toda including review of outside records, review of imaging, review of symptoms, physical exam, discussion of differential diagnosis, discussion of treatment options, and documentation.   Thank you for involving me in the care of this patient.   Geronimo Boot PA-C Dept. of Neurosurgery

## 2022-07-20 ENCOUNTER — Encounter: Payer: Self-pay | Admitting: Orthopedic Surgery

## 2022-07-20 ENCOUNTER — Ambulatory Visit: Payer: Medicare HMO | Admitting: Orthopedic Surgery

## 2022-07-20 VITALS — BP 146/86 | Ht 61.0 in | Wt 252.0 lb

## 2022-07-20 DIAGNOSIS — M48061 Spinal stenosis, lumbar region without neurogenic claudication: Secondary | ICD-10-CM | POA: Diagnosis not present

## 2022-07-20 DIAGNOSIS — M5136 Other intervertebral disc degeneration, lumbar region: Secondary | ICD-10-CM

## 2022-07-20 DIAGNOSIS — M47816 Spondylosis without myelopathy or radiculopathy, lumbar region: Secondary | ICD-10-CM | POA: Diagnosis not present

## 2022-07-20 DIAGNOSIS — M4316 Spondylolisthesis, lumbar region: Secondary | ICD-10-CM

## 2022-07-20 NOTE — Patient Instructions (Signed)
It was so nice to see you today, I am glad that you are feeling better.   You have lots of wear and tear (arthritis) in your lower back and this is likely what causes your flare ups of pain.   I put in a referral for you to see Dr. Yves Dill at the [facility]. They should call you to schedule or you can call [phone]. He will discuss possible back injections with you.   I will see you back in as needed. Please do not hesitate to call if you have any questions or concerns. You can also message me in MyChart.   Drake Leach PA-C [phone]

## 2022-08-07 DIAGNOSIS — H16223 Keratoconjunctivitis sicca, not specified as Sjogren's, bilateral: Secondary | ICD-10-CM | POA: Diagnosis not present

## 2022-08-15 DIAGNOSIS — M5441 Lumbago with sciatica, right side: Secondary | ICD-10-CM | POA: Diagnosis not present

## 2022-08-15 DIAGNOSIS — G8929 Other chronic pain: Secondary | ICD-10-CM | POA: Diagnosis not present

## 2022-08-15 DIAGNOSIS — M48062 Spinal stenosis, lumbar region with neurogenic claudication: Secondary | ICD-10-CM | POA: Diagnosis not present

## 2022-08-16 DIAGNOSIS — M159 Polyosteoarthritis, unspecified: Secondary | ICD-10-CM | POA: Diagnosis not present

## 2022-08-16 DIAGNOSIS — G8929 Other chronic pain: Secondary | ICD-10-CM | POA: Diagnosis not present

## 2022-08-16 DIAGNOSIS — Z7960 Long term (current) use of unspecified immunomodulators and immunosuppressants: Secondary | ICD-10-CM | POA: Diagnosis not present

## 2022-08-16 DIAGNOSIS — M545 Low back pain, unspecified: Secondary | ICD-10-CM | POA: Diagnosis not present

## 2022-08-16 DIAGNOSIS — M0579 Rheumatoid arthritis with rheumatoid factor of multiple sites without organ or systems involvement: Secondary | ICD-10-CM | POA: Diagnosis not present

## 2022-08-23 NOTE — Progress Notes (Deleted)
MRN : [id number]  Kristen Marquez is a 71 y.o. ([birth year]) female who presents with chief complaint of legs swell.  History of Present Illness:   The patient returns to the office for followup evaluation regarding leg swelling.  The swelling has persisted but with the lymph pump is much, much better controlled. The pain associated with swelling is essentially eliminated. There have not been any interval development of a ulcerations or wounds.   The patient denies problems with the pump, noting it is working well and the leggings are in good condition.   Since the previous visit the patient has been wearing graduated compression stockings and using the lymph pump on a routine basis and  has noted significant improvement in the lymphedema.    Patient stated the lymph pump has been a very positive factor in her care.   No outpatient medications have been marked as taking for the 08/24/22 encounter (Appointment) with Delana Meyer, Dolores Lory, MD.    Past Medical History:  Diagnosis Date   Arthritis    High cholesterol    Hypertension    Thyroid disease     No past surgical history on file.  Social History Social History   Tobacco Use   Smoking status: Former    Types: Cigarettes    Start date: 07/23/1995   Smokeless tobacco: Never  Substance Use Topics   Alcohol use: Yes   Drug use: Never    Family History Family History  Problem Relation Age of Onset   Breast cancer Sister     Allergies  Allergen Reactions   Diltiazem     Other reaction(s): Headache, Other (see comments) Other reaction(s): Other (See Comments) Headache Headache Headache Headache Severe headache    Diltiazem Hcl Other (See Comments)    Headache Headache      REVIEW OF SYSTEMS (Negative unless checked)  Constitutional: [] Weight loss  [] Fever  [] Chills Cardiac: [] Chest pain   [] Chest pressure   [] Palpitations   [] Shortness of breath when laying flat   [] Shortness of breath with  exertion. Vascular:  [] Pain in legs with walking   [x] Pain in legs with standing  [] History of DVT   [] Phlebitis   [x] Swelling in legs   [x] Varicose veins   [] Non-healing ulcers Pulmonary:   [] Uses home oxygen   [] Productive cough   [] Hemoptysis   [] Wheeze  [] COPD   [] Asthma Neurologic:  [] Dizziness   [] Seizures   [] History of stroke   [] History of TIA  [] Aphasia   [] Vissual changes   [] Weakness or numbness in arm   [] Weakness or numbness in leg Musculoskeletal:   [] Joint swelling   [x] Joint pain   [x] Low back pain Hematologic:  [] Easy bruising  [] Easy bleeding   [] Hypercoagulable state   [] Anemic Gastrointestinal:  [] Diarrhea   [] Vomiting  [] Gastroesophageal reflux/heartburn   [] Difficulty swallowing. Genitourinary:  [] Chronic kidney disease   [] Difficult urination  [] Frequent urination   [] Blood in urine Skin:  [] Rashes   [] Ulcers  Psychological:  [] History of anxiety   []  History of major depression.  Physical Examination  There were no vitals filed for this visit. There is no height or weight on file to calculate BMI. Gen: WD/WN, NAD Head: [place]/AT, No temporalis wasting.  Ear/Nose/Throat: Hearing grossly intact, nares w/o erythema or drainage, pinna without lesions Eyes: PER, EOMI, sclera nonicteric.  Neck: Supple, no gross masses.  No JVD.  Pulmonary:  Good air movement, no audible wheezing, no use of accessory muscles.  Cardiac: RRR, precordium  not hyperdynamic. Vascular:  scattered varicosities present bilaterally.  Mild venous stasis changes to the legs bilaterally.  3-4+ soft pitting edema, CEAP [id number]  Vessel Right Left  Radial Palpable Palpable  Gastrointestinal: soft, non-distended. No guarding/no peritoneal signs.  Musculoskeletal: M/S 5/5 throughout.  No deformity.  Neurologic: CN 2-12 intact. Pain and light touch intact in extremities.  Symmetrical.  Speech is fluent. Motor exam as listed above. Psychiatric: Judgment intact, Mood & affect appropriate for pt's clinical  situation. Dermatologic: Venous rashes no ulcers noted.  No changes consistent with cellulitis. Lymph : No lichenification or skin changes of chronic lymphedema.  CBC Lab Results  Component Value Date   WBC 6.7 09/18/2020   HGB 12.0 09/18/2020   HCT 37.2 09/18/2020   MCV 99.2 09/18/2020   PLT 254 09/18/2020    BMET    Component Value Date/Time   NA 137 09/18/2020 1449   K 3.6 09/18/2020 1449   CL 99 09/18/2020 1449   CO2 27 09/18/2020 1449   GLUCOSE 107 (H) 09/18/2020 1449   BUN 18 09/18/2020 1449   CREATININE 0.97 09/18/2020 1449   CALCIUM 9.5 09/18/2020 1449   GFRNONAA >60 09/18/2020 1449   CrCl cannot be calculated (Patient's most recent lab result is older than the maximum 21 days allowed.).  COAG No results found for: "INR", "PROTIME"  Radiology No results found.   Assessment/Plan There are no diagnoses linked to this encounter.   Levora Dredge, MD  08/23/2022 4:44 PM

## 2022-08-24 ENCOUNTER — Ambulatory Visit (INDEPENDENT_AMBULATORY_CARE_PROVIDER_SITE_OTHER): Payer: Medicare HMO | Admitting: Vascular Surgery

## 2022-08-24 DIAGNOSIS — I10 Essential (primary) hypertension: Secondary | ICD-10-CM

## 2022-08-24 DIAGNOSIS — K219 Gastro-esophageal reflux disease without esophagitis: Secondary | ICD-10-CM

## 2022-08-24 DIAGNOSIS — I890 Lymphedema, not elsewhere classified: Secondary | ICD-10-CM

## 2022-08-24 DIAGNOSIS — I872 Venous insufficiency (chronic) (peripheral): Secondary | ICD-10-CM

## 2022-08-24 DIAGNOSIS — M5137 Other intervertebral disc degeneration, lumbosacral region: Secondary | ICD-10-CM

## 2022-09-05 DIAGNOSIS — M8580 Other specified disorders of bone density and structure, unspecified site: Secondary | ICD-10-CM | POA: Diagnosis not present

## 2022-09-05 DIAGNOSIS — K219 Gastro-esophageal reflux disease without esophagitis: Secondary | ICD-10-CM | POA: Diagnosis not present

## 2022-09-05 DIAGNOSIS — Z1331 Encounter for screening for depression: Secondary | ICD-10-CM | POA: Diagnosis not present

## 2022-09-05 DIAGNOSIS — E785 Hyperlipidemia, unspecified: Secondary | ICD-10-CM | POA: Diagnosis not present

## 2022-09-05 DIAGNOSIS — M1990 Unspecified osteoarthritis, unspecified site: Secondary | ICD-10-CM | POA: Diagnosis not present

## 2022-09-05 DIAGNOSIS — Z0000 Encounter for general adult medical examination without abnormal findings: Secondary | ICD-10-CM | POA: Diagnosis not present

## 2022-09-05 DIAGNOSIS — E039 Hypothyroidism, unspecified: Secondary | ICD-10-CM | POA: Diagnosis not present

## 2022-09-05 DIAGNOSIS — I10 Essential (primary) hypertension: Secondary | ICD-10-CM | POA: Diagnosis not present

## 2022-09-05 DIAGNOSIS — I890 Lymphedema, not elsewhere classified: Secondary | ICD-10-CM | POA: Diagnosis not present

## 2022-09-07 DIAGNOSIS — E785 Hyperlipidemia, unspecified: Secondary | ICD-10-CM | POA: Diagnosis not present

## 2022-09-07 DIAGNOSIS — M0539 Rheumatoid heart disease with rheumatoid arthritis of multiple sites: Secondary | ICD-10-CM | POA: Diagnosis not present

## 2022-09-07 DIAGNOSIS — E7800 Pure hypercholesterolemia, unspecified: Secondary | ICD-10-CM | POA: Diagnosis not present

## 2022-09-07 DIAGNOSIS — R8290 Unspecified abnormal findings in urine: Secondary | ICD-10-CM | POA: Diagnosis not present

## 2022-09-07 DIAGNOSIS — I10 Essential (primary) hypertension: Secondary | ICD-10-CM | POA: Diagnosis not present

## 2022-09-07 DIAGNOSIS — N1831 Chronic kidney disease, stage 3a: Secondary | ICD-10-CM | POA: Diagnosis not present

## 2022-09-07 DIAGNOSIS — R809 Proteinuria, unspecified: Secondary | ICD-10-CM | POA: Diagnosis not present

## 2022-09-07 DIAGNOSIS — E663 Overweight: Secondary | ICD-10-CM | POA: Diagnosis not present

## 2022-09-07 DIAGNOSIS — M0569 Rheumatoid arthritis of multiple sites with involvement of other organs and systems: Secondary | ICD-10-CM | POA: Diagnosis not present

## 2022-09-13 DIAGNOSIS — N1831 Chronic kidney disease, stage 3a: Secondary | ICD-10-CM | POA: Diagnosis not present

## 2022-09-13 DIAGNOSIS — M0569 Rheumatoid arthritis of multiple sites with involvement of other organs and systems: Secondary | ICD-10-CM | POA: Diagnosis not present

## 2022-09-13 DIAGNOSIS — E785 Hyperlipidemia, unspecified: Secondary | ICD-10-CM | POA: Diagnosis not present

## 2022-09-13 DIAGNOSIS — I10 Essential (primary) hypertension: Secondary | ICD-10-CM | POA: Diagnosis not present

## 2022-09-13 DIAGNOSIS — E663 Overweight: Secondary | ICD-10-CM | POA: Diagnosis not present

## 2022-09-13 DIAGNOSIS — R809 Proteinuria, unspecified: Secondary | ICD-10-CM | POA: Diagnosis not present

## 2022-09-13 DIAGNOSIS — D631 Anemia in chronic kidney disease: Secondary | ICD-10-CM | POA: Diagnosis not present

## 2022-09-21 IMAGING — CR DG HIP (WITH OR WITHOUT PELVIS) 2-3V*R*
1 series · 3 of 3 positions shown · non-contrast
Comparison: None.

CLINICAL DATA: Right hip pain

EXAM:
DG HIP (WITH OR WITHOUT PELVIS) 2-3V RIGHT

[Series 1: dg hip unilat w or w/o pelvis 2-3 views  · non-contrast · 0.14mm/px · 3 of 3 slices shown]
[im 1/3]
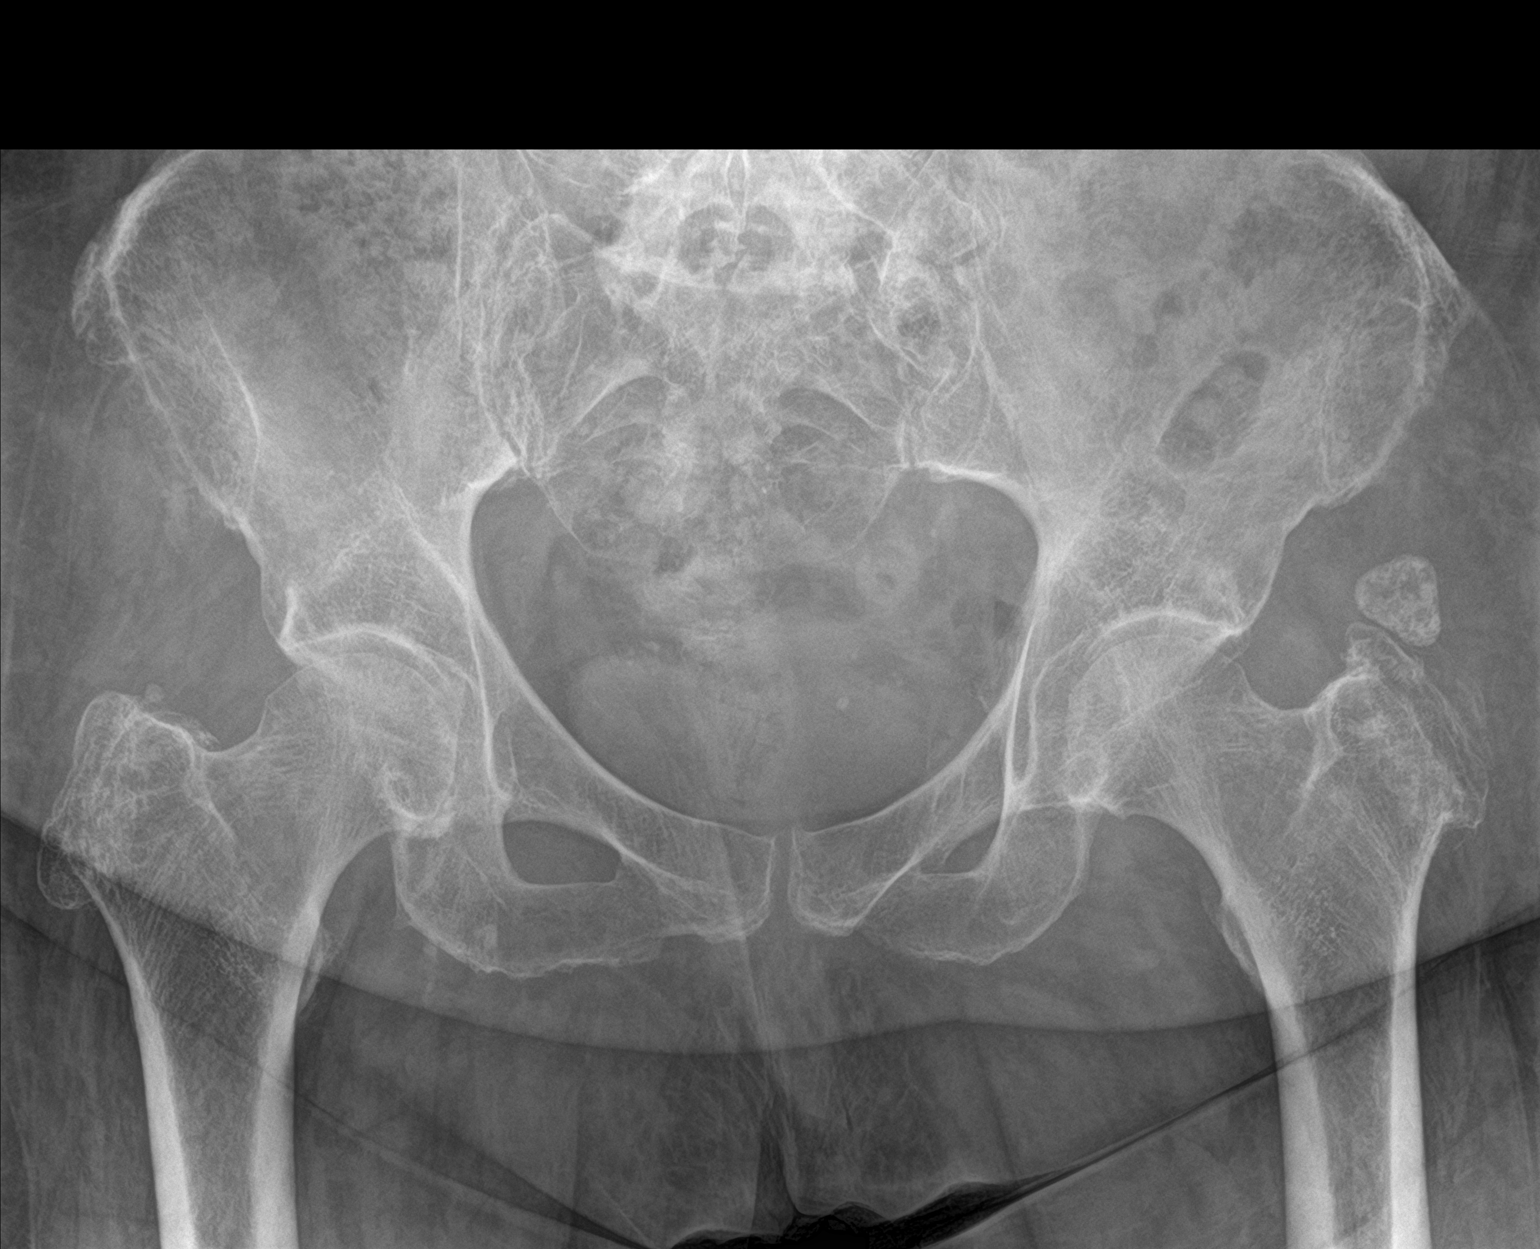
[im 2/3]
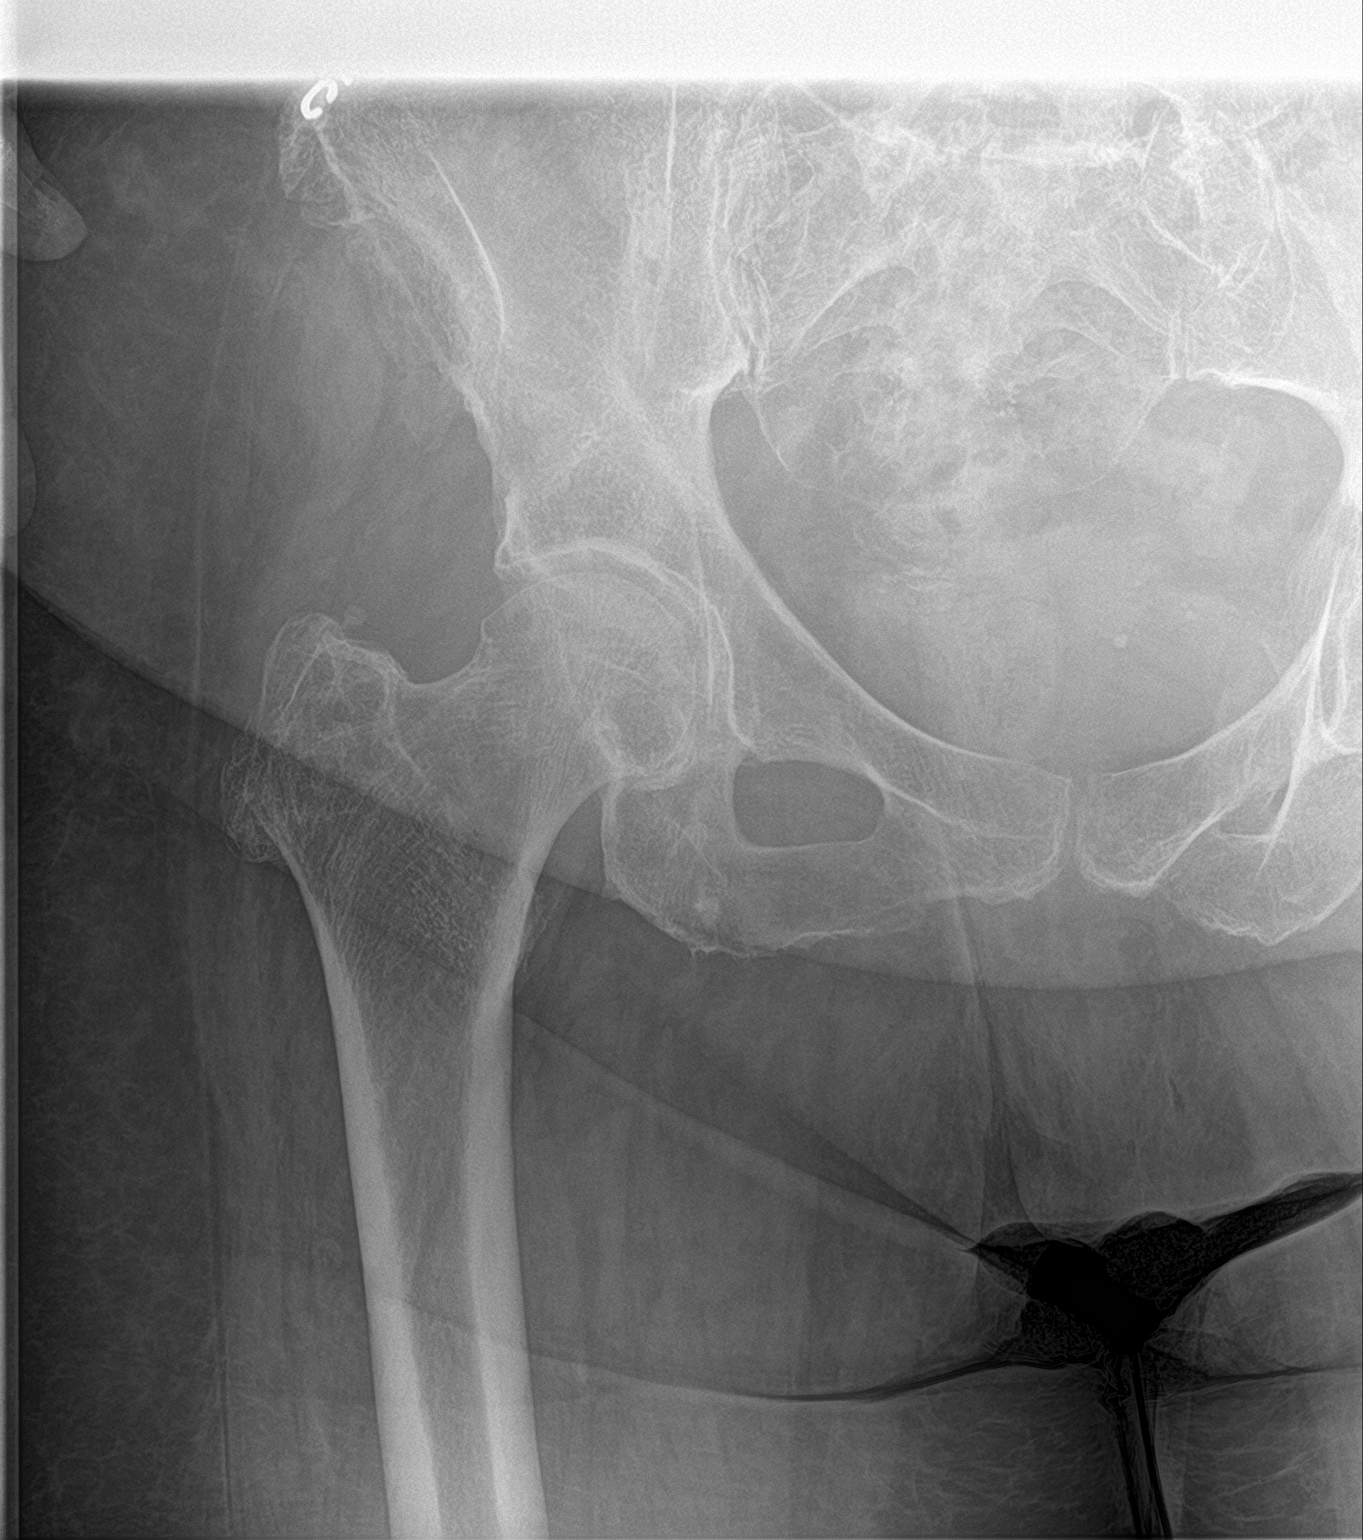
[im 3/3]
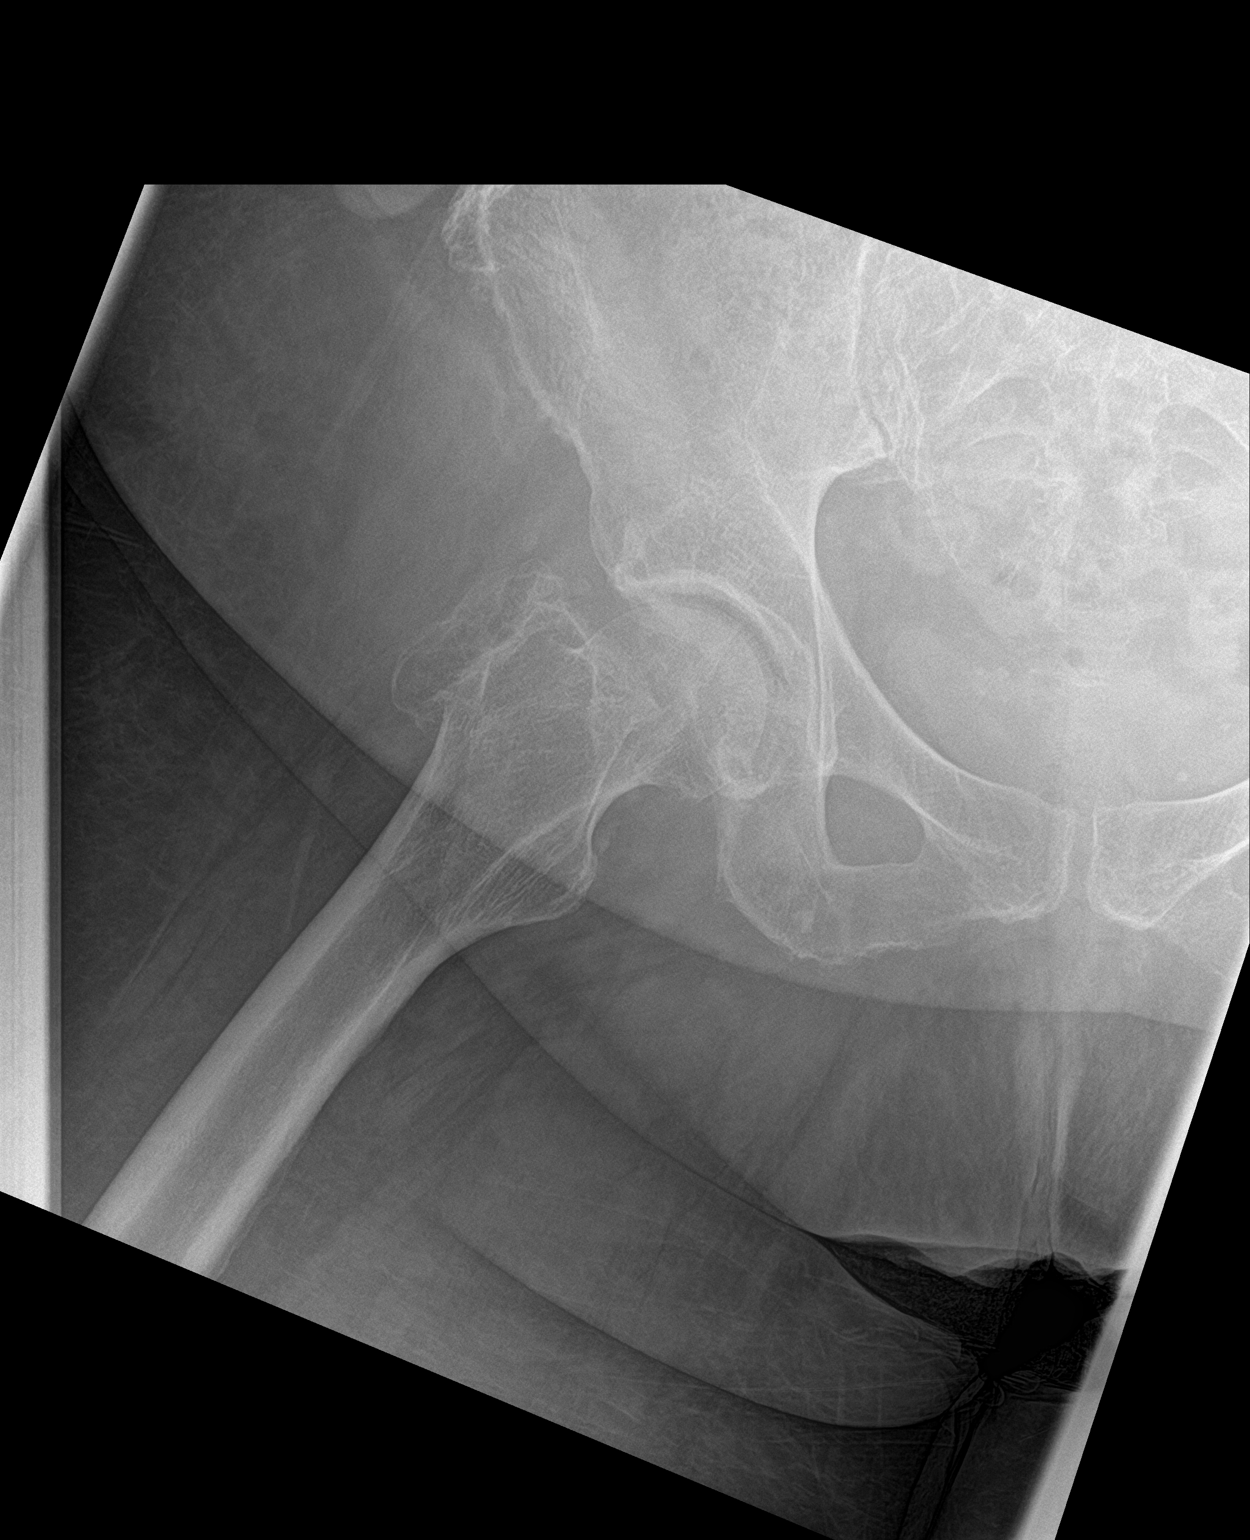

[3 of 3 positions shown; findings below may reference images not displayed]

FINDINGS: Single view radiograph of the pelvis and two view radiograph of the
right hip demonstrates normal alignment. No fracture or dislocation.
No destructive osseous lesion. Hip joint spaces are preserved.
Heterotopic ossification is seen adjacent to the greater trochanters
bilaterally left greater than right. Soft tissues are unremarkable.
IMPRESSION: No acute fracture or dislocation.

## 2022-09-27 NOTE — Progress Notes (Signed)
MRN : [id number]  Kristen Marquez is a 72 y.o. (07/18/1951) female who presents with chief complaint of legs hurt and swell.  History of Present Illness:   The patient returns to the office for followup evaluation regarding leg swelling.  The swelling has persisted but with the lymph pump is much, much better controlled. The pain associated with swelling is essentially eliminated. There have not been any interval development of a ulcerations or wounds.  However, recently she has noted increased swelling which seems to correlate with travel and prolonged dependency.   The patient denies problems with the pump, noting it is working well and the leggings are in good condition.   Since the previous visit the patient has been wearing graduated compression stockings and using the lymph pump on a routine basis and  has noted significant improvement in the lymphedema.    Patient stated the lymph pump remains a positive factor in her care.   No outpatient medications have been marked as taking for the 09/28/22 encounter (Appointment) with Delana Meyer, Dolores Lory, MD.    Past Medical History:  Diagnosis Date   Arthritis    High cholesterol    Hypertension    Thyroid disease     No past surgical history on file.  Social History Social History   Tobacco Use   Smoking status: Former    Types: Cigarettes    Start date: 07/23/1995   Smokeless tobacco: Never  Substance Use Topics   Alcohol use: Yes   Drug use: Never    Family History Family History  Problem Relation Age of Onset   Breast cancer Sister     Allergies  Allergen Reactions   Diltiazem     Other reaction(s): Headache, Other (see comments) Other reaction(s): Other (See Comments) Headache Headache Headache Headache Severe headache    Diltiazem Hcl Other (See Comments)    Headache Headache      REVIEW OF SYSTEMS (Negative unless checked)  Constitutional: [] Weight loss  [] Fever  [] Chills Cardiac: [] Chest  pain   [] Chest pressure   [] Palpitations   [] Shortness of breath when laying flat   [] Shortness of breath with exertion. Vascular:  [] Pain in legs with walking   [x] Pain in legs at rest  [] History of DVT   [] Phlebitis   [x] Swelling in legs   [] Varicose veins   [] Non-healing ulcers Pulmonary:   [] Uses home oxygen   [] Productive cough   [] Hemoptysis   [] Wheeze  [] COPD   [] Asthma Neurologic:  [] Dizziness   [] Seizures   [] History of stroke   [] History of TIA  [] Aphasia   [] Vissual changes   [] Weakness or numbness in arm   [] Weakness or numbness in leg Musculoskeletal:   [] Joint swelling   [x] Joint pain   [x] Low back pain Hematologic:  [] Easy bruising  [] Easy bleeding   [] Hypercoagulable state   [] Anemic Gastrointestinal:  [] Diarrhea   [] Vomiting  [x] Gastroesophageal reflux/heartburn   [] Difficulty swallowing. Genitourinary:  [] Chronic kidney disease   [] Difficult urination  [] Frequent urination   [] Blood in urine Skin:  [] Rashes   [] Ulcers  Psychological:  [] History of anxiety   []  History of major depression.  Physical Examination  There were no vitals filed for this visit. There is no height or weight on file to calculate BMI. Gen: WD/WN, NAD Head: [place]/AT, No temporalis wasting.  Ear/Nose/Throat: Hearing grossly intact, nares w/o erythema or drainage, pinna without lesions Eyes: PER, EOMI, sclera nonicteric.  Neck: Supple, no  gross masses.  No JVD.  Pulmonary:  Good air movement, no audible wheezing, no use of accessory muscles.  Cardiac: RRR, precordium not hyperdynamic. Vascular:  scattered varicosities present bilaterally.  Moderate venous stasis changes to the legs bilaterally.  3 + firm pitting edema  Vessel Right Left  Radial Palpable Palpable  Gastrointestinal: soft, non-distended. No guarding/no peritoneal signs.  Musculoskeletal: M/S 5/5 throughout.  No deformity.  Neurologic: CN 2-12 intact. Pain and light touch intact in extremities.  Symmetrical.  Speech is fluent. Motor exam as  listed above. Psychiatric: Judgment intact, Mood & affect appropriate for pt's clinical situation. Dermatologic: Venous rashes no ulcers noted.  No changes consistent with cellulitis. Lymph : No lichenification or skin changes of chronic lymphedema.  CBC Lab Results  Component Value Date   WBC 6.7 09/18/2020   HGB 12.0 09/18/2020   HCT 37.2 09/18/2020   MCV 99.2 09/18/2020   PLT 254 09/18/2020    BMET    Component Value Date/Time   NA 137 09/18/2020 1449   K 3.6 09/18/2020 1449   CL 99 09/18/2020 1449   CO2 27 09/18/2020 1449   GLUCOSE 107 (H) 09/18/2020 1449   BUN 18 09/18/2020 1449   CREATININE 0.97 09/18/2020 1449   CALCIUM 9.5 09/18/2020 1449   GFRNONAA >60 09/18/2020 1449   CrCl cannot be calculated (Patient's most recent lab result is older than the maximum 21 days allowed.).  COAG No results found for: "INR", "PROTIME"  Radiology No results found.   Assessment/Plan 1. Lymphedema, not elsewhere classified Recommend:  No surgery or intervention at this point in time.    I have reviewed my discussion with the patient regarding lymphedema and why it  causes symptoms.  Patient will continue wearing graduated compression on a daily basis. The patient should put the compression on first thing in the morning and removing them in the evening. The patient should not sleep in the compression.   I have stressed the importance of compression in conjunction with the pump and using the pump as much as possible.  We also discussed bringing the pump when traveling.  In addition, behavioral modification throughout the day will be continued.  This will include frequent elevation (such as in a recliner), use of over the counter pain medications as needed and exercise such as walking.  The systemic causes for chronic edema such as liver, kidney and cardiac etiologies does not appear to have significant changed over the past year.    The patient will continue aggressive use of the   lymph pump.  This will continue to improve the edema control and prevent sequela such as ulcers and infections.   The patient will follow-up with me on an annual basis.    A total of 30 minutes was spent with this patient and greater than 50% was spent in counseling and coordination of care with the patient.  Discussion included the treatment options for vascular disease including indications for surgery and intervention.  Also discussed is the appropriate timing of treatment.  In addition medical therapy was discussed.  2. Venous (peripheral) insufficiency Recommend:  No surgery or intervention at this point in time.    I have reviewed my discussion with the patient regarding lymphedema and why it  causes symptoms.  Patient will continue wearing graduated compression on a daily basis. The patient should put the compression on first thing in the morning and removing them in the evening. The patient should not sleep in the compression.  I have stressed the importance of compression in conjunction with the pump and using the pump as much as possible.  We also discussed bringing the pump when traveling.  In addition, behavioral modification throughout the day will be continued.  This will include frequent elevation (such as in a recliner), use of over the counter pain medications as needed and exercise such as walking.  The systemic causes for chronic edema such as liver, kidney and cardiac etiologies does not appear to have significant changed over the past year.    The patient will continue aggressive use of the  lymph pump.  This will continue to improve the edema control and prevent sequela such as ulcers and infections.   The patient will follow-up with me on an annual basis.    A total of 30 minutes was spent with this patient and greater than 50% was spent in counseling and coordination of care with the patient.  Discussion included the treatment options for vascular disease including  indications for surgery and intervention.  Also discussed is the appropriate timing of treatment.  In addition medical therapy was discussed.  3. Primary hypertension Continue antihypertensive medications as already ordered, these medications have been reviewed and there are no changes at this time.  4. Gastroesophageal reflux disease, unspecified whether esophagitis present Continue PPI as already ordered, this medication has been reviewed and there are no changes at this time.  Avoidence of caffeine and alcohol  Moderate elevation of the head of the bed   5. DDD (degenerative disc disease), lumbosacral Continue NSAID medications as already ordered, these medications have been reviewed and there are no changes at this time.  Continued activity and therapy was stressed.    Levora Dredge, MD  09/27/2022 7:58 AM

## 2022-09-28 ENCOUNTER — Encounter (INDEPENDENT_AMBULATORY_CARE_PROVIDER_SITE_OTHER): Payer: Self-pay | Admitting: Vascular Surgery

## 2022-09-28 ENCOUNTER — Ambulatory Visit (INDEPENDENT_AMBULATORY_CARE_PROVIDER_SITE_OTHER): Payer: Medicare HMO | Admitting: Vascular Surgery

## 2022-09-28 VITALS — BP 147/79 | HR 75 | Resp 16 | Wt 263.0 lb

## 2022-09-28 DIAGNOSIS — M5137 Other intervertebral disc degeneration, lumbosacral region: Secondary | ICD-10-CM

## 2022-09-28 DIAGNOSIS — I872 Venous insufficiency (chronic) (peripheral): Secondary | ICD-10-CM

## 2022-09-28 DIAGNOSIS — I890 Lymphedema, not elsewhere classified: Secondary | ICD-10-CM | POA: Diagnosis not present

## 2022-09-28 DIAGNOSIS — M51379 Other intervertebral disc degeneration, lumbosacral region without mention of lumbar back pain or lower extremity pain: Secondary | ICD-10-CM

## 2022-09-28 DIAGNOSIS — K219 Gastro-esophageal reflux disease without esophagitis: Secondary | ICD-10-CM | POA: Diagnosis not present

## 2022-09-28 DIAGNOSIS — I10 Essential (primary) hypertension: Secondary | ICD-10-CM | POA: Diagnosis not present

## 2022-09-28 DIAGNOSIS — M810 Age-related osteoporosis without current pathological fracture: Secondary | ICD-10-CM | POA: Diagnosis not present

## 2022-10-01 ENCOUNTER — Encounter (INDEPENDENT_AMBULATORY_CARE_PROVIDER_SITE_OTHER): Payer: Self-pay | Admitting: Vascular Surgery

## 2022-10-05 DIAGNOSIS — R809 Proteinuria, unspecified: Secondary | ICD-10-CM | POA: Diagnosis not present

## 2022-10-05 DIAGNOSIS — E663 Overweight: Secondary | ICD-10-CM | POA: Diagnosis not present

## 2022-10-05 DIAGNOSIS — I10 Essential (primary) hypertension: Secondary | ICD-10-CM | POA: Diagnosis not present

## 2022-10-05 DIAGNOSIS — N1831 Chronic kidney disease, stage 3a: Secondary | ICD-10-CM | POA: Diagnosis not present

## 2022-10-05 DIAGNOSIS — N179 Acute kidney failure, unspecified: Secondary | ICD-10-CM | POA: Diagnosis not present

## 2022-10-05 DIAGNOSIS — M0539 Rheumatoid heart disease with rheumatoid arthritis of multiple sites: Secondary | ICD-10-CM | POA: Diagnosis not present

## 2022-10-05 DIAGNOSIS — E785 Hyperlipidemia, unspecified: Secondary | ICD-10-CM | POA: Diagnosis not present

## 2022-10-05 DIAGNOSIS — E7800 Pure hypercholesterolemia, unspecified: Secondary | ICD-10-CM | POA: Diagnosis not present

## 2022-10-05 DIAGNOSIS — M0569 Rheumatoid arthritis of multiple sites with involvement of other organs and systems: Secondary | ICD-10-CM | POA: Diagnosis not present

## 2022-10-15 ENCOUNTER — Emergency Department: Payer: Medicare HMO

## 2022-10-15 ENCOUNTER — Emergency Department
Admission: EM | Admit: 2022-10-15 | Discharge: 2022-10-15 | Disposition: A | Discharge status: Home / Self Care | Payer: Medicare HMO | Attending: Emergency Medicine | Admitting: Emergency Medicine

## 2022-10-15 DIAGNOSIS — I10 Essential (primary) hypertension: Secondary | ICD-10-CM | POA: Insufficient documentation

## 2022-10-15 DIAGNOSIS — E039 Hypothyroidism, unspecified: Secondary | ICD-10-CM | POA: Insufficient documentation

## 2022-10-15 DIAGNOSIS — M7989 Other specified soft tissue disorders: Secondary | ICD-10-CM | POA: Diagnosis not present

## 2022-10-15 DIAGNOSIS — M7731 Calcaneal spur, right foot: Secondary | ICD-10-CM | POA: Diagnosis not present

## 2022-10-15 DIAGNOSIS — S99911A Unspecified injury of right ankle, initial encounter: Secondary | ICD-10-CM

## 2022-10-15 DIAGNOSIS — X501XXA Overexertion from prolonged static or awkward postures, initial encounter: Secondary | ICD-10-CM | POA: Diagnosis not present

## 2022-10-15 MED ORDER — OXYCODONE-ACETAMINOPHEN 5-325 MG PO TABS: 1.0000 | ORAL_TABLET | Freq: Four times a day (QID) | ORAL | 0 refills | Status: AC | PRN

## 2022-10-15 NOTE — Discharge Instructions (Addendum)
-  Fortunately, your x-rays not show any signs of fractures.  Suspect that you likely sprained some ligaments in your foot/ankle.  This should heal on its own within the next few weeks.  You may take Tylenol and meloxicam as needed for pain.  If needed, you may take oxycodone as well.  -Recommend rest, ice, compression with Ace bandage, and elevation over the next week.  -If your symptoms fail to improve after a few weeks, you may follow-up with the podiatrist listed on this page.  You may call him to schedule an appointment.  -Return to the emergency department anytime if you begin to experience any new or worsening symptoms.

## 2022-10-15 NOTE — ED Notes (Signed)
Patient has bilateral LE lymphedema. Right foot is not wrapped for compression as the left foot/ankle is. Right foot appears swollen.

## 2022-10-15 NOTE — ED Provider Notes (Signed)
[facility] Provider Note    Event Date/Time   First MD Initiated Contact with Patient 10/15/22 2208     (approximate)   History   Chief Complaint Ankle Pain   HPI Kristen Marquez is a 72 y.o. female, history of hypertension, hyperlipidemia, hypothyroidism, osteoporosis, peripheral venous insufficiency, presents to the emergency department for evaluation of right ankle pain.  She states that she was recently on a cruise when she was getting wheeled to a door and got her right foot caught on the door frame.  Since then, she has had persistent swelling in the right ankle and endorses pain while walking.  Denies leg pain, knee pain, hip pain, fever/chills, paresthesias, cold sensation, weakness, or dizziness/lightheadedness.  History Limitations: No limitations.        Physical Exam  Triage Vital Signs: ED Triage Vitals  Enc Vitals Group     BP 10/15/22 2151 (!) 137/54     Pulse Rate 10/15/22 2151 88     Resp 10/15/22 2151 17     Temp 10/15/22 2151 98 F (36.7 C)     Temp Source 10/15/22 2151 Oral     SpO2 10/15/22 2151 96 %     Weight 10/15/22 2152 257 lb (116.6 kg)     Height --      Head Circumference --      Peak Flow --      Pain Score 10/15/22 2152 7     Pain Loc --      Pain Edu? --      Excl. in [place]? --     Most recent vital signs: Vitals:   10/15/22 2151 10/15/22 2334  BP: (!) 137/54 128/62  Pulse: 88 86  Resp: 17 18  Temp: 98 F (36.7 C) 98.1 F (36.7 C)  SpO2: 96% 92%    General: Awake, NAD.  Skin: Warm, dry. No rashes or lesions.  Eyes: PERRL. Conjunctivae normal.  CV: Good peripheral perfusion.  Resp: Normal effort.  Abd: Soft, non-tender. No distention.  Neuro: At baseline. No gross neurological deficits.  Musculoskeletal: Normal ROM of all extremities.  Focused Exam: Significant lymphedema present in the right lower extremity, 2+ pitting edema.  Patient states that this is her baseline.  No deformities to the right  foot/ankle.  No significant tenderness on exam, though she does have some pain with flexion and eversion.  No joint laxity.  PMS intact distally.  Physical Exam    ED Results / Procedures / Treatments  Labs (all labs ordered are listed, but only abnormal results are displayed) Labs Reviewed - No data to display   EKG N/A.    RADIOLOGY  ED Provider Interpretation: I personally viewed and interpreted this x-ray, no evidence of acute osseous abnormalities.  DG Ankle Complete Right  Result Date: 10/15/2022 CLINICAL DATA:  Swelling with pain EXAM: RIGHT ANKLE - COMPLETE 3+ VIEW COMPARISON:  None Available. FINDINGS: There is no evidence of fracture, dislocation, or joint effusion. Osteopenia. There is no evidence of arthropathy or other focal bone abnormality. Prominent plantar calcaneal spurring. Subcutaneous soft tissue swelling about the distal leg and about the ankle. IMPRESSION: 1. No acute osseous abnormality. 2. Marked subcutaneous soft tissue swelling about the distal leg and about the ankle. Electronically Signed   By: Keane Police D.O.   On: 10/15/2022 22:22    PROCEDURES:  Critical Care performed: N/A.  Procedures    MEDICATIONS ORDERED IN ED: Medications - No data to display  IMPRESSION / MDM / ASSESSMENT AND PLAN / ED COURSE  I reviewed the triage vital signs and the nursing notes.                              Differential diagnosis includes, but is not limited to, ankle sprain, malleoli or fracture, metatarsal fracture  Assessment/Plan Presentation consistent with ankle sprain.  X-rays not show any evidence of acute fractures.  She is still able to ambulate on her own, I does endorse moderate pain.  Her leg is unable to fit in a cam boot due to the lymphedema.  Provided her with a Ace bandage wrap.  Advised her to rest, ice, provide compression, and elevate when able.  She is currently taken Tylenol and meloxicam, though states that this is not enough to control  her pain.  Provided with a brief prescription for oxycodone to be used as needed.  Provided her with a referral to podiatry to follow-up with if her symptoms fail to improve after a few weeks.  She was agreeable to this.  Will discharge.  Provided the patient with anticipatory guidance, return precautions, and educational material. Encouraged the patient to return to the emergency department at any time if they begin to experience any new or worsening symptoms. Patient expressed understanding and agreed with the plan.   Patient's presentation is most consistent with acute complicated illness / injury requiring diagnostic workup.       FINAL CLINICAL IMPRESSION(S) / ED DIAGNOSES   Final diagnoses:  Ankle injury, right, initial encounter     Rx / DC Orders   ED Discharge Orders          Ordered    oxyCODONE-acetaminophen (PERCOCET/ROXICET) 5-325 MG tablet  Every 6 hours PRN        10/15/22 2326             Note:  This document was prepared using Dragon voice recognition software and may include unintentional dictation errors.   Teodoro Spray, [place] 10/16/22 [employer]    Lucillie Garfinkel, MD 10/19/22 [phone]

## 2022-10-15 NOTE — ED Triage Notes (Signed)
Pt sts that she was on a 7 day cruise and when she was on the ship she twisted her ankle and it is now swollen and painful to walk on. Pt does have bilat lower leg lymphedema.

## 2022-10-16 NOTE — ED Provider Notes (Incomplete)
[facility] Provider Note    Event Date/Time   First MD Initiated Contact with Patient 10/15/22 2208     (approximate)   History   Chief Complaint Ankle Pain   HPI Kristen Marquez is a 72 y.o. female,    *** Denies fever/chills, chest pain, shortness of breath, abdominal pain, flank pain, nausea/vomiting, diarrhea, urinary symptoms, headache, weakness, vision changes, hearing changes, rashes/lesions, numbness/tingling in upper or lower extremities, or dizziness/lightheadedness.   Per records review, ***  History Limitations: ***        Physical Exam  Triage Vital Signs: ED Triage Vitals  Enc Vitals Group     BP 10/15/22 2151 (!) 137/54     Pulse Rate 10/15/22 2151 88     Resp 10/15/22 2151 17     Temp 10/15/22 2151 98 F (36.7 C)     Temp Source 10/15/22 2151 Oral     SpO2 10/15/22 2151 96 %     Weight 10/15/22 2152 257 lb (116.6 kg)     Height --      Head Circumference --      Peak Flow --      Pain Score 10/15/22 2152 7     Pain Loc --      Pain Edu? --      Excl. in [place]? --     Most recent vital signs: Vitals:   10/15/22 2151  BP: (!) 137/54  Pulse: 88  Resp: 17  Temp: 98 F (36.7 C)  SpO2: 96%    General: Awake, NAD.  Skin: Warm, dry. No rashes or lesions.  Eyes: PERRL. Conjunctivae normal.  CV: Good peripheral perfusion.  Resp: Normal effort.  Abd: Soft, non-tender. No distention.  Neuro: At baseline. No gross neurological deficits.  Musculoskeletal: Normal ROM of all extremities.  Focused Exam: ***  Physical Exam    ED Results / Procedures / Treatments  Labs (all labs ordered are listed, but only abnormal results are displayed) Labs Reviewed - No data to display   EKG ***    RADIOLOGY  ED Provider Interpretation: ***  DG Ankle Complete Right  Result Date: 10/15/2022 CLINICAL DATA:  Swelling with pain EXAM: RIGHT ANKLE - COMPLETE 3+ VIEW COMPARISON:  None Available. FINDINGS: There is no evidence of  fracture, dislocation, or joint effusion. Osteopenia. There is no evidence of arthropathy or other focal bone abnormality. Prominent plantar calcaneal spurring. Subcutaneous soft tissue swelling about the distal leg and about the ankle. IMPRESSION: 1. No acute osseous abnormality. 2. Marked subcutaneous soft tissue swelling about the distal leg and about the ankle. Electronically Signed   By: Keane Police D.O.   On: 10/15/2022 22:22    PROCEDURES:  Critical Care performed: ***  Procedures    MEDICATIONS ORDERED IN ED: Medications - No data to display   IMPRESSION / MDM / [place] / ED COURSE  I reviewed the triage vital signs and the nursing notes.                              Differential diagnosis includes, but is not limited to, ***  ED Course ***  Assessment/Plan ***  Considered admission for this patient, but ***  ***Provided the patient with anticipatory guidance, return precautions, and educational material. Encouraged the patient to return to the emergency department at any time if they begin to experience any new or worsening symptoms. Patient expressed understanding and  agreed with the plan.   Patient's presentation is most consistent with {EM COPA:27473}       FINAL CLINICAL IMPRESSION(S) / ED DIAGNOSES   Final diagnoses:  None     Rx / DC Orders   ED Discharge Orders     None        Note:  This document was prepared using Dragon voice recognition software and may include unintentional dictation errors.

## 2022-11-07 DIAGNOSIS — H168 Other keratitis: Secondary | ICD-10-CM | POA: Diagnosis not present

## 2022-11-07 DIAGNOSIS — H16223 Keratoconjunctivitis sicca, not specified as Sjogren's, bilateral: Secondary | ICD-10-CM | POA: Diagnosis not present

## 2022-11-07 DIAGNOSIS — Z0100 Encounter for examination of eyes and vision without abnormal findings: Secondary | ICD-10-CM | POA: Diagnosis not present

## 2022-11-30 DIAGNOSIS — Z0100 Encounter for examination of eyes and vision without abnormal findings: Secondary | ICD-10-CM | POA: Diagnosis not present

## 2022-12-19 DIAGNOSIS — Z7960 Long term (current) use of unspecified immunomodulators and immunosuppressants: Secondary | ICD-10-CM | POA: Diagnosis not present

## 2022-12-19 DIAGNOSIS — M0579 Rheumatoid arthritis with rheumatoid factor of multiple sites without organ or systems involvement: Secondary | ICD-10-CM | POA: Diagnosis not present

## 2022-12-19 DIAGNOSIS — M48061 Spinal stenosis, lumbar region without neurogenic claudication: Secondary | ICD-10-CM | POA: Diagnosis not present

## 2022-12-19 DIAGNOSIS — M159 Polyosteoarthritis, unspecified: Secondary | ICD-10-CM | POA: Diagnosis not present

## 2022-12-19 DIAGNOSIS — R053 Chronic cough: Secondary | ICD-10-CM | POA: Diagnosis not present

## 2023-01-08 DIAGNOSIS — E785 Hyperlipidemia, unspecified: Secondary | ICD-10-CM | POA: Diagnosis not present

## 2023-01-08 DIAGNOSIS — I10 Essential (primary) hypertension: Secondary | ICD-10-CM | POA: Diagnosis not present

## 2023-01-08 DIAGNOSIS — R809 Proteinuria, unspecified: Secondary | ICD-10-CM | POA: Diagnosis not present

## 2023-01-08 DIAGNOSIS — R8290 Unspecified abnormal findings in urine: Secondary | ICD-10-CM | POA: Diagnosis not present

## 2023-01-08 DIAGNOSIS — M0569 Rheumatoid arthritis of multiple sites with involvement of other organs and systems: Secondary | ICD-10-CM | POA: Diagnosis not present

## 2023-01-08 DIAGNOSIS — N1831 Chronic kidney disease, stage 3a: Secondary | ICD-10-CM | POA: Diagnosis not present

## 2023-01-08 DIAGNOSIS — M0539 Rheumatoid heart disease with rheumatoid arthritis of multiple sites: Secondary | ICD-10-CM | POA: Diagnosis not present

## 2023-01-08 DIAGNOSIS — E663 Overweight: Secondary | ICD-10-CM | POA: Diagnosis not present

## 2023-01-08 DIAGNOSIS — E7800 Pure hypercholesterolemia, unspecified: Secondary | ICD-10-CM | POA: Diagnosis not present

## 2023-01-16 DIAGNOSIS — M0569 Rheumatoid arthritis of multiple sites with involvement of other organs and systems: Secondary | ICD-10-CM | POA: Diagnosis not present

## 2023-01-16 DIAGNOSIS — N1831 Chronic kidney disease, stage 3a: Secondary | ICD-10-CM | POA: Diagnosis not present

## 2023-01-16 DIAGNOSIS — D631 Anemia in chronic kidney disease: Secondary | ICD-10-CM | POA: Diagnosis not present

## 2023-01-16 DIAGNOSIS — K219 Gastro-esophageal reflux disease without esophagitis: Secondary | ICD-10-CM | POA: Diagnosis not present

## 2023-01-16 DIAGNOSIS — R809 Proteinuria, unspecified: Secondary | ICD-10-CM | POA: Diagnosis not present

## 2023-01-16 DIAGNOSIS — E663 Overweight: Secondary | ICD-10-CM | POA: Diagnosis not present

## 2023-01-16 DIAGNOSIS — E785 Hyperlipidemia, unspecified: Secondary | ICD-10-CM | POA: Diagnosis not present

## 2023-01-16 DIAGNOSIS — I10 Essential (primary) hypertension: Secondary | ICD-10-CM | POA: Diagnosis not present

## 2023-02-12 DIAGNOSIS — H168 Other keratitis: Secondary | ICD-10-CM | POA: Diagnosis not present

## 2023-02-12 DIAGNOSIS — H16223 Keratoconjunctivitis sicca, not specified as Sjogren's, bilateral: Secondary | ICD-10-CM | POA: Diagnosis not present

## 2023-03-07 DIAGNOSIS — E039 Hypothyroidism, unspecified: Secondary | ICD-10-CM | POA: Diagnosis not present

## 2023-03-07 DIAGNOSIS — Z0000 Encounter for general adult medical examination without abnormal findings: Secondary | ICD-10-CM | POA: Diagnosis not present

## 2023-03-07 DIAGNOSIS — E785 Hyperlipidemia, unspecified: Secondary | ICD-10-CM | POA: Diagnosis not present

## 2023-03-07 DIAGNOSIS — M1990 Unspecified osteoarthritis, unspecified site: Secondary | ICD-10-CM | POA: Diagnosis not present

## 2023-03-07 DIAGNOSIS — I10 Essential (primary) hypertension: Secondary | ICD-10-CM | POA: Diagnosis not present

## 2023-03-07 DIAGNOSIS — K219 Gastro-esophageal reflux disease without esophagitis: Secondary | ICD-10-CM | POA: Diagnosis not present

## 2023-03-07 DIAGNOSIS — I890 Lymphedema, not elsewhere classified: Secondary | ICD-10-CM | POA: Diagnosis not present

## 2023-03-28 DIAGNOSIS — R609 Edema, unspecified: Secondary | ICD-10-CM | POA: Diagnosis not present

## 2023-03-28 DIAGNOSIS — Z6841 Body Mass Index (BMI) 40.0 and over, adult: Secondary | ICD-10-CM | POA: Diagnosis not present

## 2023-03-28 DIAGNOSIS — R030 Elevated blood-pressure reading, without diagnosis of hypertension: Secondary | ICD-10-CM | POA: Diagnosis not present

## 2023-03-28 DIAGNOSIS — L309 Dermatitis, unspecified: Secondary | ICD-10-CM | POA: Diagnosis not present

## 2023-03-29 DIAGNOSIS — R600 Localized edema: Secondary | ICD-10-CM | POA: Diagnosis not present

## 2023-03-29 DIAGNOSIS — M7989 Other specified soft tissue disorders: Secondary | ICD-10-CM | POA: Diagnosis not present

## 2023-04-09 DIAGNOSIS — R053 Chronic cough: Secondary | ICD-10-CM | POA: Diagnosis not present

## 2023-04-23 DIAGNOSIS — Z7960 Long term (current) use of unspecified immunomodulators and immunosuppressants: Secondary | ICD-10-CM | POA: Diagnosis not present

## 2023-04-23 DIAGNOSIS — M47816 Spondylosis without myelopathy or radiculopathy, lumbar region: Secondary | ICD-10-CM | POA: Diagnosis not present

## 2023-04-23 DIAGNOSIS — M0579 Rheumatoid arthritis with rheumatoid factor of multiple sites without organ or systems involvement: Secondary | ICD-10-CM | POA: Diagnosis not present

## 2023-04-23 DIAGNOSIS — M159 Polyosteoarthritis, unspecified: Secondary | ICD-10-CM | POA: Diagnosis not present

## 2023-05-01 IMAGING — MR MR HIP*L* W/O CM
4 of 5 series · 26 of 40 positions shown · non-contrast
Comparison: None.

CLINICAL DATA: Left hip pain for 2 weeks

EXAM:
MR OF THE LEFT HIP WITHOUT CONTRAST
TECHNIQUE: Multiplanar, multisequence MR imaging was performed. No intravenous
contrast was administered.

[Series 2: T1 · coronal · left · 4.0mm · 0.59mm/px · 5 of 38 slices shown]
[im 1/38]
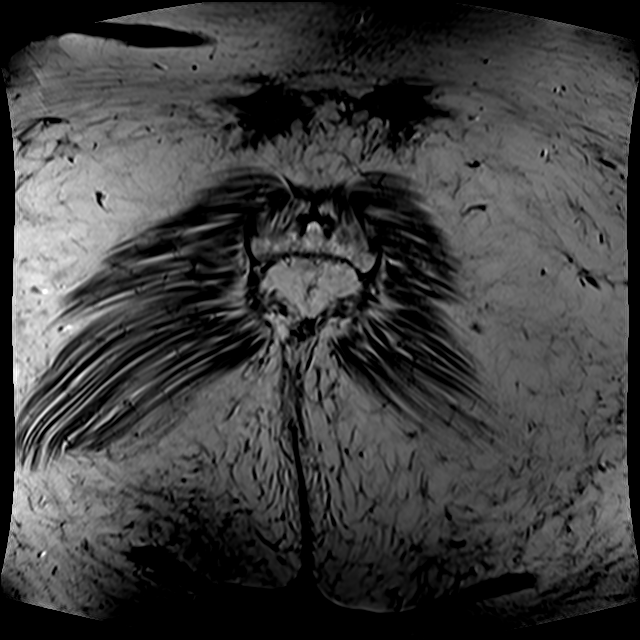
[im 5/38]
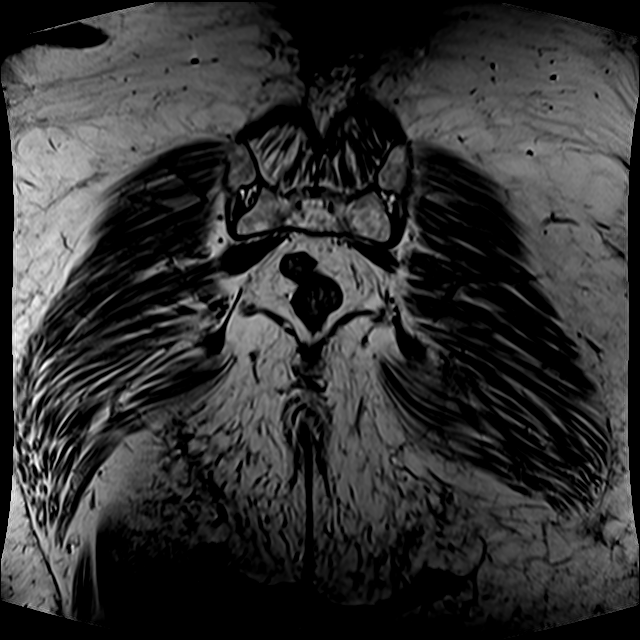
[im 13/38]
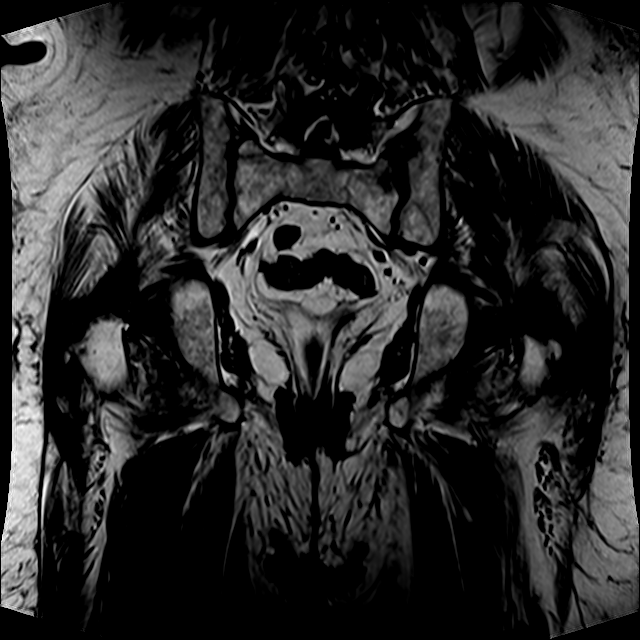
[im 21/38]
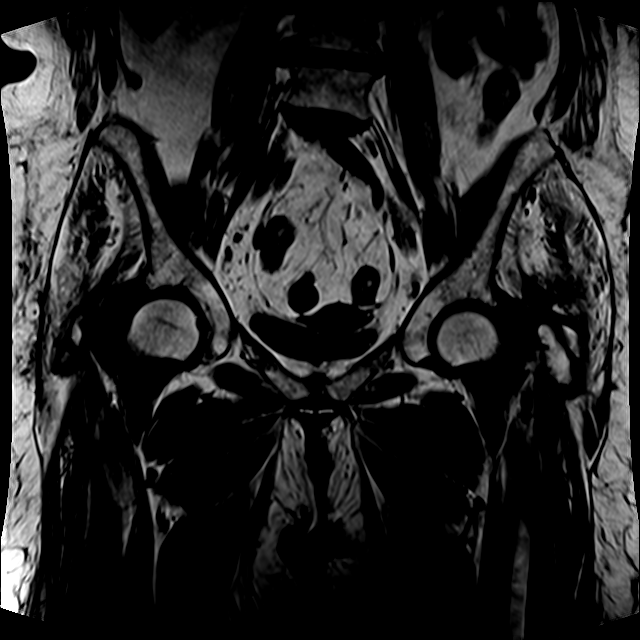
[im 33/38]
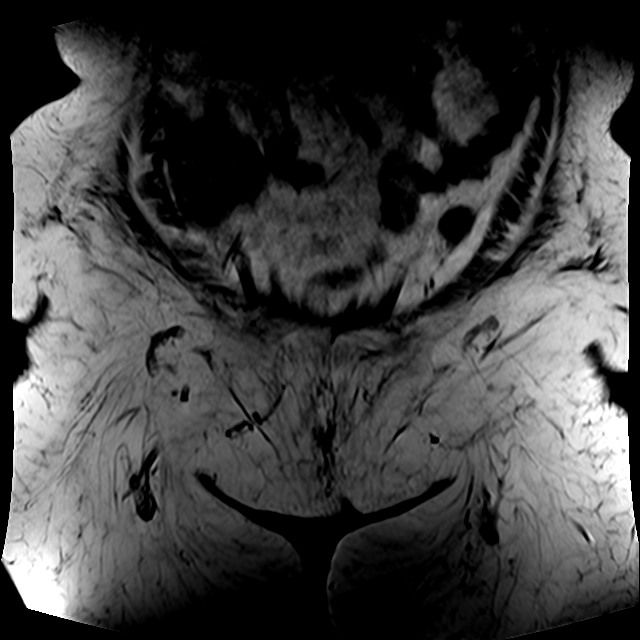

[Series 4: T2 fat-sat · axial · left · 4.0mm · 0.75mm/px · z∈[-84,+61]mm · 7 of 30 slices shown]
[im 1/30]
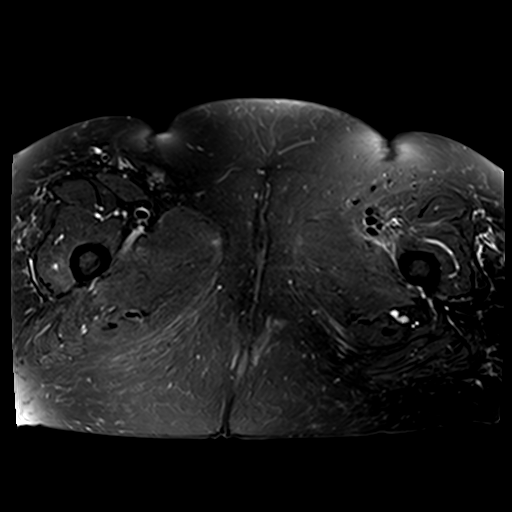
[im 5/30]
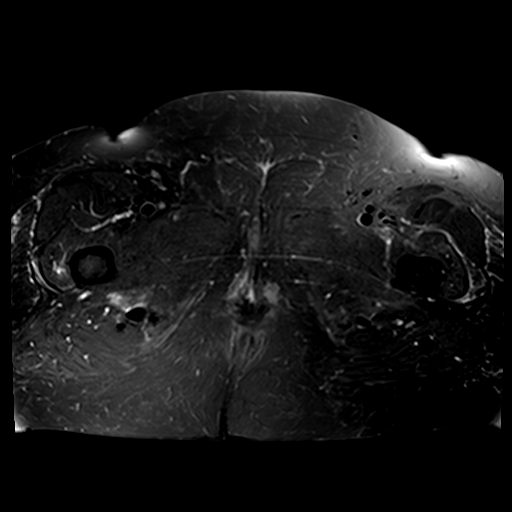
[im 10/30]
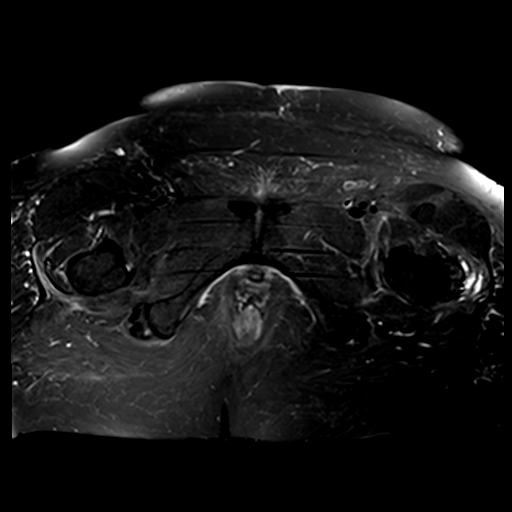
[im 15/30]
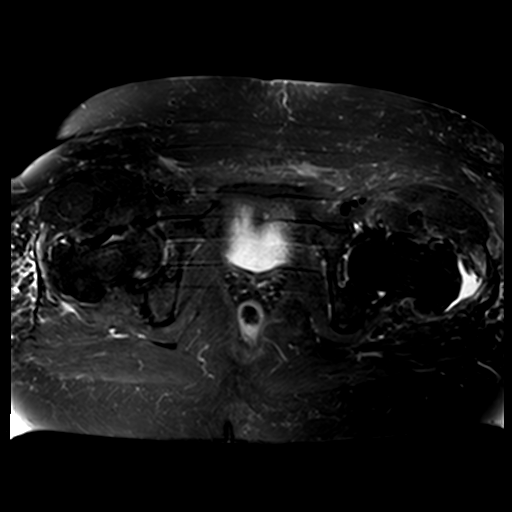
[im 20/30]
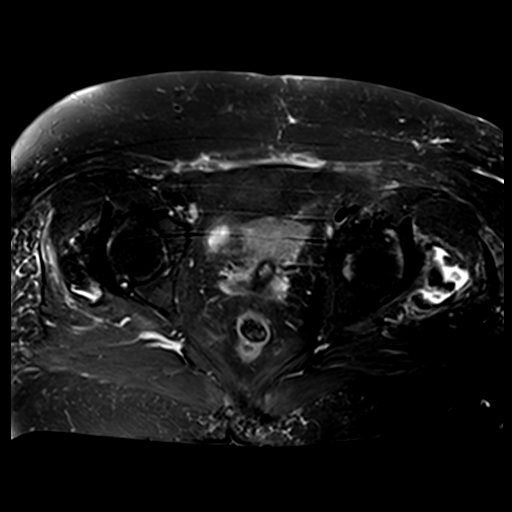
[im 25/30]
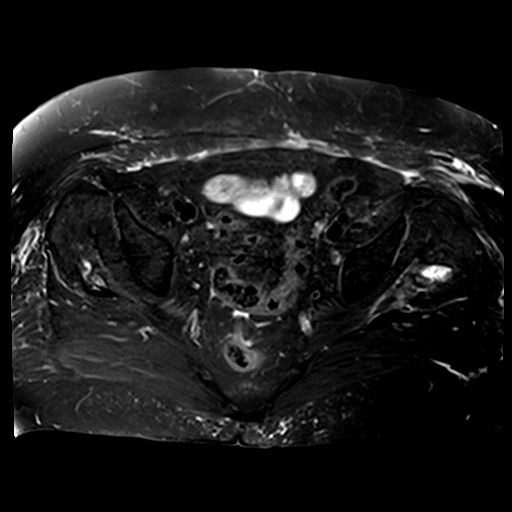
[im 30/30]
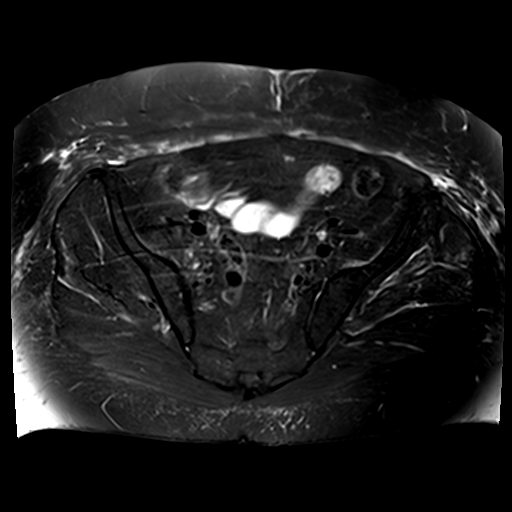

[Series 5: PD fat-sat · sagittal · left · 4.0mm · 0.70mm/px · 7 of 29 slices shown (1 of 2)]
[im 1/29]
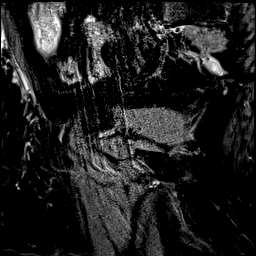
[im 5/29]
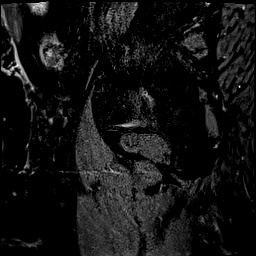
[im 10/29]
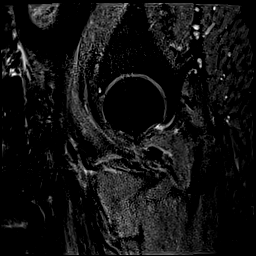
[im 15/29]
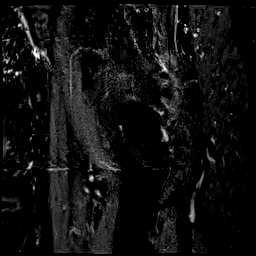
[im 19/29]
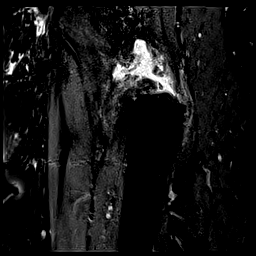
[im 24/29]
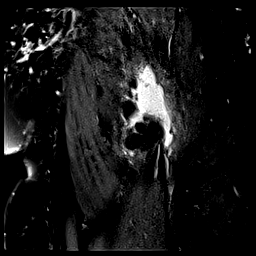
[im 29/29]
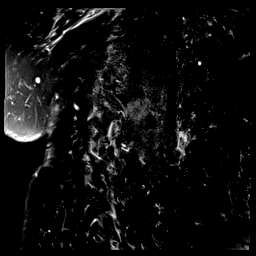

[Series 6: PD fat-sat · coronal · left · 4.0mm · 0.70mm/px · 7 of 29 slices shown (2 of 2)]
[im 1/29]
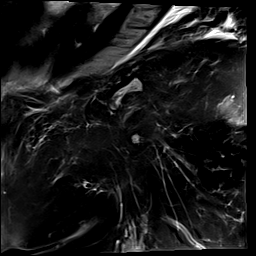
[im 5/29]
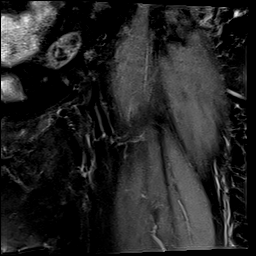
[im 10/29]
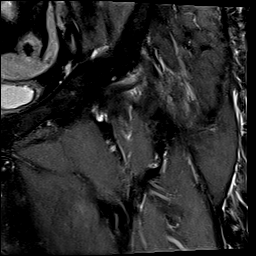
[im 15/29]
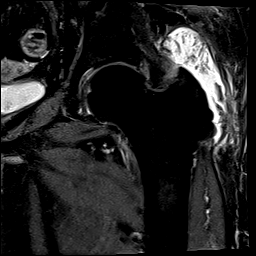
[im 19/29]
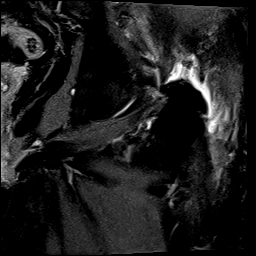
[im 24/29]
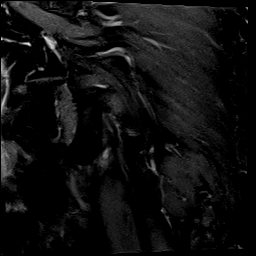
[im 29/29]
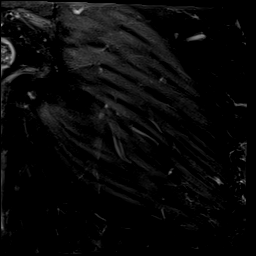

[26 of 40 positions shown; findings below may reference images not displayed]

FINDINGS: Bones:

No hip fracture, dislocation or avascular necrosis. No periosteal
reaction or bone destruction. No aggressive osseous lesion.

Normal sacrum and sacroiliac joints. No SI joint widening or erosive
changes.

Degenerative disease with disc height loss of the lower lumbar spine
with bilateral facet arthropathy.

Articular cartilage and labrum

Articular cartilage: Partial-thickness cartilage loss of the femoral
head and acetabulum bilaterally.

Labrum:  Maceration of the left superior and anterior labrum.

Joint or bursal effusion

Joint effusion:  No hip joint effusion.  No SI joint effusion.

Bursae: Large amount of fluid in the left greater trochanteric bursa
with small areas of ossification within the fluid.

Muscles and tendons

Flexors: Normal.

Extensors: Normal.

Abductors: Normal.

Adductors: Normal.

Gluteals: High-grade partial-thickness tear of the right gluteus
medius tendon insertion. Partial-thickness tear of the left gluteus
medius tendon insertion. Partial-thickness tear of the right gluteus
minimus tendon insertion.

Hamstrings: Mild tendinosis of the left hamstring origin. Mild
tendinosis of the right hamstring origin with a small partial
thickness tear.

Other findings

No pelvic free fluid. No fluid collection or hematoma. No inguinal
lymphadenopathy. No inguinal hernia.
IMPRESSION: 1. Severe left greater trochanteric bursitis.
2. Maceration of the left superior and anterior labrum.
3. Mild osteoarthritis of bilateral hips.
4. High-grade partial-thickness tear of the right gluteus medius
tendon insertion. Partial-thickness tear of the right gluteus
minimus tendon insertion.
5. Partial-thickness tear of the left gluteus medius tendon
insertion.
6. Mild tendinosis of the left hamstring origin with a small partial
thickness tear.
7. Mild tendinosis of the right hamstring origin with a small
partial thickness tear.

## 2023-05-08 DIAGNOSIS — I10 Essential (primary) hypertension: Secondary | ICD-10-CM | POA: Diagnosis not present

## 2023-05-08 DIAGNOSIS — E785 Hyperlipidemia, unspecified: Secondary | ICD-10-CM | POA: Diagnosis not present

## 2023-05-08 DIAGNOSIS — I890 Lymphedema, not elsewhere classified: Secondary | ICD-10-CM | POA: Diagnosis not present

## 2023-05-08 DIAGNOSIS — M069 Rheumatoid arthritis, unspecified: Secondary | ICD-10-CM | POA: Diagnosis not present

## 2023-05-08 DIAGNOSIS — R32 Unspecified urinary incontinence: Secondary | ICD-10-CM | POA: Diagnosis not present

## 2023-05-08 DIAGNOSIS — E559 Vitamin D deficiency, unspecified: Secondary | ICD-10-CM | POA: Diagnosis not present

## 2023-05-08 DIAGNOSIS — Z7689 Persons encountering health services in other specified circumstances: Secondary | ICD-10-CM | POA: Diagnosis not present

## 2023-05-08 DIAGNOSIS — M545 Low back pain, unspecified: Secondary | ICD-10-CM | POA: Diagnosis not present

## 2023-05-08 DIAGNOSIS — E038 Other specified hypothyroidism: Secondary | ICD-10-CM | POA: Diagnosis not present

## 2023-05-08 DIAGNOSIS — J45909 Unspecified asthma, uncomplicated: Secondary | ICD-10-CM | POA: Diagnosis not present

## 2023-05-22 DIAGNOSIS — R600 Localized edema: Secondary | ICD-10-CM | POA: Diagnosis not present

## 2023-05-22 DIAGNOSIS — I890 Lymphedema, not elsewhere classified: Secondary | ICD-10-CM | POA: Diagnosis not present

## 2023-05-24 DIAGNOSIS — I890 Lymphedema, not elsewhere classified: Secondary | ICD-10-CM | POA: Diagnosis not present

## 2023-05-25 DIAGNOSIS — N1832 Chronic kidney disease, stage 3b: Secondary | ICD-10-CM | POA: Diagnosis not present

## 2023-05-25 DIAGNOSIS — R319 Hematuria, unspecified: Secondary | ICD-10-CM | POA: Diagnosis not present

## 2023-05-31 DIAGNOSIS — R600 Localized edema: Secondary | ICD-10-CM | POA: Diagnosis not present

## 2023-05-31 DIAGNOSIS — I890 Lymphedema, not elsewhere classified: Secondary | ICD-10-CM | POA: Diagnosis not present

## 2023-06-05 DIAGNOSIS — I890 Lymphedema, not elsewhere classified: Secondary | ICD-10-CM | POA: Diagnosis not present

## 2023-06-05 DIAGNOSIS — R600 Localized edema: Secondary | ICD-10-CM | POA: Diagnosis not present

## 2023-06-12 DIAGNOSIS — I890 Lymphedema, not elsewhere classified: Secondary | ICD-10-CM | POA: Diagnosis not present

## 2023-06-12 DIAGNOSIS — R600 Localized edema: Secondary | ICD-10-CM | POA: Diagnosis not present

## 2023-06-14 DIAGNOSIS — Z01419 Encounter for gynecological examination (general) (routine) without abnormal findings: Secondary | ICD-10-CM | POA: Diagnosis not present

## 2023-06-14 DIAGNOSIS — R159 Full incontinence of feces: Secondary | ICD-10-CM | POA: Diagnosis not present

## 2023-06-14 DIAGNOSIS — N3941 Urge incontinence: Secondary | ICD-10-CM | POA: Diagnosis not present

## 2023-06-18 DIAGNOSIS — N3941 Urge incontinence: Secondary | ICD-10-CM | POA: Diagnosis not present

## 2023-06-20 DIAGNOSIS — R600 Localized edema: Secondary | ICD-10-CM | POA: Diagnosis not present

## 2023-06-20 DIAGNOSIS — I890 Lymphedema, not elsewhere classified: Secondary | ICD-10-CM | POA: Diagnosis not present

## 2023-07-04 DIAGNOSIS — I890 Lymphedema, not elsewhere classified: Secondary | ICD-10-CM | POA: Diagnosis not present

## 2023-07-04 DIAGNOSIS — R600 Localized edema: Secondary | ICD-10-CM | POA: Diagnosis not present

## 2023-07-09 DIAGNOSIS — H179 Unspecified corneal scar and opacity: Secondary | ICD-10-CM | POA: Diagnosis not present

## 2023-07-09 DIAGNOSIS — H16223 Keratoconjunctivitis sicca, not specified as Sjogren's, bilateral: Secondary | ICD-10-CM | POA: Diagnosis not present

## 2023-07-09 DIAGNOSIS — Z23 Encounter for immunization: Secondary | ICD-10-CM | POA: Diagnosis not present

## 2023-07-09 DIAGNOSIS — M0510 Rheumatoid lung disease with rheumatoid arthritis of unspecified site: Secondary | ICD-10-CM | POA: Diagnosis not present

## 2023-07-09 DIAGNOSIS — H168 Other keratitis: Secondary | ICD-10-CM | POA: Diagnosis not present

## 2023-07-12 DIAGNOSIS — Z1231 Encounter for screening mammogram for malignant neoplasm of breast: Secondary | ICD-10-CM | POA: Diagnosis not present

## 2023-07-17 DIAGNOSIS — I890 Lymphedema, not elsewhere classified: Secondary | ICD-10-CM | POA: Diagnosis not present

## 2023-07-17 DIAGNOSIS — R600 Localized edema: Secondary | ICD-10-CM | POA: Diagnosis not present

## 2023-07-31 DIAGNOSIS — I890 Lymphedema, not elsewhere classified: Secondary | ICD-10-CM | POA: Diagnosis not present

## 2023-07-31 DIAGNOSIS — R600 Localized edema: Secondary | ICD-10-CM | POA: Diagnosis not present

## 2023-09-29 DIAGNOSIS — I890 Lymphedema, not elsewhere classified: Secondary | ICD-10-CM | POA: Insufficient documentation

## 2023-09-29 NOTE — Progress Notes (Deleted)
MRN : [id number]  Kristen Marquez is a 73 y.o. (1951/02/04) female who presents with chief complaint of legs swell.  History of Present Illness:   The patient returns to the office for followup evaluation regarding leg swelling.  The swelling has persisted but with the lymph pump is much, much better controlled. The pain associated with swelling is essentially eliminated. There have not been any interval development of a ulcerations or wounds.  However, recently she has noted increased swelling which seems to correlate with travel and prolonged dependency.   The patient denies problems with the pump, noting it is working well and the leggings are in good condition.   Since the previous visit the patient has been wearing graduated compression stockings and using the lymph pump on a routine basis and  has noted significant improvement in the lymphedema.    Patient stated the lymph pump remains a positive factor in her care.     No outpatient medications have been marked as taking for the 10/01/23 encounter (Appointment) with Gilda Crease, Latina Craver, MD.    Past Medical History:  Diagnosis Date   Arthritis    High cholesterol    Hypertension    Thyroid disease     No past surgical history on file.  Social History Social History   Tobacco Use   Smoking status: Former    Types: Cigarettes    Start date: 07/23/1995   Smokeless tobacco: Never  Substance Use Topics   Alcohol use: Yes   Drug use: Never    Family History Family History  Problem Relation Age of Onset   Breast cancer Sister     Allergies  Allergen Reactions   Diltiazem     Other reaction(s): Headache, Other (see comments) Other reaction(s): Other (See Comments) Headache Headache Headache Headache Severe headache    Diltiazem Hcl Other (See Comments)    Headache Headache      REVIEW OF SYSTEMS (Negative unless checked)  Constitutional: [] Weight loss  [] Fever   [] Chills Cardiac: [] Chest pain   [] Chest pressure   [] Palpitations   [] Shortness of breath when laying flat   [] Shortness of breath with exertion. Vascular:  [] Pain in legs with walking   [x] Pain in legs with standing  [] History of DVT   [] Phlebitis   [x] Swelling in legs   [] Varicose veins   [] Non-healing ulcers Pulmonary:   [] Uses home oxygen   [] Productive cough   [] Hemoptysis   [] Wheeze  [] COPD   [] Asthma Neurologic:  [] Dizziness   [] Seizures   [] History of stroke   [] History of TIA  [] Aphasia   [] Vissual changes   [] Weakness or numbness in arm   [] Weakness or numbness in leg Musculoskeletal:   [] Joint swelling   [] Joint pain   [] Low back pain Hematologic:  [] Easy bruising  [] Easy bleeding   [] Hypercoagulable state   [] Anemic Gastrointestinal:  [] Diarrhea   [] Vomiting  [] Gastroesophageal reflux/heartburn   [] Difficulty swallowing. Genitourinary:  [] Chronic kidney disease   [] Difficult urination  [] Frequent urination   [] Blood in urine Skin:  [] Rashes   [] Ulcers  Psychological:  [] History of anxiety   []  History of major depression.  Physical Examination  There were no vitals filed for this visit. There is no height or weight on file to calculate BMI. Gen: WD/WN, NAD Head: [place]/AT, No temporalis wasting.  Ear/Nose/Throat: Hearing  grossly intact, nares w/o erythema or drainage, pinna without lesions Eyes: PER, EOMI, sclera nonicteric.  Neck: Supple, no gross masses.  No JVD.  Pulmonary:  Good air movement, no audible wheezing, no use of accessory muscles.  Cardiac: RRR, precordium not hyperdynamic. Vascular:  scattered varicosities present bilaterally.  Mild venous stasis changes to the legs bilaterally.  3-4+ soft pitting edema, CEAP [id number]  Vessel Right Left  Radial Palpable Palpable  Gastrointestinal: soft, non-distended. No guarding/no peritoneal signs.  Musculoskeletal: M/S 5/5 throughout.  No deformity.  Neurologic: CN 2-12 intact. Pain and light touch intact in extremities.   Symmetrical.  Speech is fluent. Motor exam as listed above. Psychiatric: Judgment intact, Mood & affect appropriate for pt's clinical situation. Dermatologic: Venous rashes no ulcers noted.  No changes consistent with cellulitis. Lymph : No lichenification or skin changes of chronic lymphedema.  CBC Lab Results  Component Value Date   WBC 6.7 09/18/2020   HGB 12.0 09/18/2020   HCT 37.2 09/18/2020   MCV 99.2 09/18/2020   PLT 254 09/18/2020    BMET    Component Value Date/Time   NA 137 09/18/2020 1449   K 3.6 09/18/2020 1449   CL 99 09/18/2020 1449   CO2 27 09/18/2020 1449   GLUCOSE 107 (H) 09/18/2020 1449   BUN 18 09/18/2020 1449   CREATININE 0.97 09/18/2020 1449   CALCIUM 9.5 09/18/2020 1449   GFRNONAA >60 09/18/2020 1449   CrCl cannot be calculated (Patient's most recent lab result is older than the maximum 21 days allowed.).  COAG No results found for: "INR", "PROTIME"  Radiology No results found.   Assessment/Plan There are no diagnoses linked to this encounter.   Levora Dredge, MD  09/29/2023 3:29 PM

## 2023-10-01 ENCOUNTER — Ambulatory Visit (INDEPENDENT_AMBULATORY_CARE_PROVIDER_SITE_OTHER): Payer: Medicare HMO | Admitting: Vascular Surgery

## 2023-10-01 DIAGNOSIS — I10 Essential (primary) hypertension: Secondary | ICD-10-CM

## 2023-10-01 DIAGNOSIS — N1831 Chronic kidney disease, stage 3a: Secondary | ICD-10-CM

## 2023-10-01 DIAGNOSIS — I890 Lymphedema, not elsewhere classified: Secondary | ICD-10-CM

## 2023-10-01 DIAGNOSIS — E785 Hyperlipidemia, unspecified: Secondary | ICD-10-CM
